# Patient Record
Sex: Female | Born: 1980 | Race: Black or African American | Hispanic: No | Marital: Married | Smoking: Never smoker
Health system: Southern US, Community
[De-identification: ages and names within clinical notes are randomized; demographics above are authoritative.]

## PROBLEM LIST (undated history)

## (undated) DIAGNOSIS — G43909 Migraine, unspecified, not intractable, without status migrainosus: Secondary | ICD-10-CM

## (undated) DIAGNOSIS — F419 Anxiety disorder, unspecified: Secondary | ICD-10-CM

## (undated) DIAGNOSIS — I10 Essential (primary) hypertension: Secondary | ICD-10-CM

## (undated) DIAGNOSIS — E282 Polycystic ovarian syndrome: Secondary | ICD-10-CM

## (undated) DIAGNOSIS — F32A Depression, unspecified: Secondary | ICD-10-CM

## (undated) DIAGNOSIS — F329 Major depressive disorder, single episode, unspecified: Secondary | ICD-10-CM

## (undated) HISTORY — DX: Essential (primary) hypertension: I10

## (undated) HISTORY — DX: Anxiety disorder, unspecified: F41.9

## (undated) HISTORY — DX: Polycystic ovarian syndrome: E28.2

## (undated) HISTORY — DX: Depression, unspecified: F32.A

## (undated) HISTORY — DX: Migraine, unspecified, not intractable, without status migrainosus: G43.909

---
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
## 1999-09-04 ENCOUNTER — Emergency Department (HOSPITAL_COMMUNITY): Admission: EM | Admit: 1999-09-04 | Discharge: 1999-09-04 | Payer: Self-pay | Admitting: Emergency Medicine

## 1999-09-08 ENCOUNTER — Emergency Department (HOSPITAL_COMMUNITY): Admission: EM | Admit: 1999-09-08 | Discharge: 1999-09-08 | Payer: Self-pay

## 2000-08-25 ENCOUNTER — Other Ambulatory Visit: Admission: RE | Admit: 2000-08-25 | Discharge: 2000-08-25 | Payer: Self-pay | Admitting: Family Medicine

## 2000-09-06 ENCOUNTER — Emergency Department (HOSPITAL_COMMUNITY): Admission: EM | Admit: 2000-09-06 | Discharge: 2000-09-06 | Payer: Self-pay | Admitting: Internal Medicine

## 2000-09-06 ENCOUNTER — Encounter: Payer: Self-pay | Admitting: Internal Medicine

## 2000-09-07 ENCOUNTER — Encounter: Payer: Self-pay | Admitting: Emergency Medicine

## 2000-09-07 ENCOUNTER — Emergency Department (HOSPITAL_COMMUNITY): Admission: EM | Admit: 2000-09-07 | Discharge: 2000-09-07 | Payer: Self-pay | Admitting: Emergency Medicine

## 2001-01-19 ENCOUNTER — Encounter: Admission: RE | Admit: 2001-01-19 | Discharge: 2001-04-19 | Payer: Self-pay | Admitting: Family Medicine

## 2003-01-03 ENCOUNTER — Other Ambulatory Visit: Admission: RE | Admit: 2003-01-03 | Discharge: 2003-01-03 | Payer: Self-pay | Admitting: *Deleted

## 2003-06-14 ENCOUNTER — Ambulatory Visit (HOSPITAL_COMMUNITY): Admission: RE | Admit: 2003-06-14 | Discharge: 2003-06-14 | Payer: Self-pay | Admitting: Gynecology

## 2003-06-14 ENCOUNTER — Ambulatory Visit (HOSPITAL_BASED_OUTPATIENT_CLINIC_OR_DEPARTMENT_OTHER): Admission: RE | Admit: 2003-06-14 | Discharge: 2003-06-14 | Payer: Self-pay | Admitting: Gynecology

## 2003-06-14 ENCOUNTER — Encounter (INDEPENDENT_AMBULATORY_CARE_PROVIDER_SITE_OTHER): Payer: Self-pay | Admitting: *Deleted

## 2004-05-11 ENCOUNTER — Other Ambulatory Visit: Admission: RE | Admit: 2004-05-11 | Discharge: 2004-05-11 | Payer: Self-pay | Admitting: Gynecology

## 2005-08-03 ENCOUNTER — Other Ambulatory Visit: Admission: RE | Admit: 2005-08-03 | Discharge: 2005-08-03 | Payer: Self-pay | Admitting: Gynecology

## 2006-07-26 HISTORY — PX: ENDOMETRIAL BIOPSY: SHX622

## 2006-11-23 ENCOUNTER — Encounter (INDEPENDENT_AMBULATORY_CARE_PROVIDER_SITE_OTHER): Payer: Self-pay | Admitting: Specialist

## 2006-11-23 ENCOUNTER — Ambulatory Visit (HOSPITAL_COMMUNITY): Admission: RE | Admit: 2006-11-23 | Discharge: 2006-11-23 | Payer: Self-pay | Admitting: Obstetrics and Gynecology

## 2008-05-01 ENCOUNTER — Encounter: Admission: RE | Admit: 2008-05-01 | Discharge: 2008-05-01 | Payer: Self-pay | Admitting: Family Medicine

## 2010-12-11 NOTE — H&P (Signed)
Molly Clements, Molly Clements                     ACCOUNT NO.:  [id number]   MEDICAL RECORD NO.:  [id number]                   PATIENT TYPE:  AMB   LOCATION:  [facility]                                 FACILITY:  [facility]   PHYSICIAN:  Juan H. Lily Peer, M.D.             DATE OF BIRTH:  September 24, 1980   DATE OF ADMISSION:  DATE OF DISCHARGE:                                HISTORY & PHYSICAL   DATE OF SCHEDULED SURGERY:  Friday, June 14, 2003 at 7:30 a.m. at [facility].   CHIEF COMPLAINT:  Dysfunctional uterine bleeding attributed to endometrial  polyp.   HISTORY:  The patient is a 30 year old gravida 0 who was seen as a new  patient in our office on May 10, 2003 as a consult from Clovis Riley,  nurse practitioner here in [place] due to the fact that the patient had  had history of oligomenorrhea.  Her workup had consisted of TSH, prolactin,  17-hydroxyprogesterone, and DHEAS which were normal.  Prior to referral to  our office she had had an [facility] and LH that had been normal as well.  The  patient as part of her workup had an ultrasound at which there was a  questionable elongated polyp in the endometrial cavity so she was seen in  the office on November 3 for a sonohysterogram which demonstrated evidence  of what appears to be polycystic ovaries based on the patient being  overweight and some evidence of hirsutism.  The sonohysterogram demonstrated  that the endometrial cavity had two wall defects noted measuring 5 x 3 mm  and 6 x 7 mm near the lower uterine segment.  The uterus was normal  dimensions - 8.6 x 3.1 x 5.5. cm.  The patient had had a full examination  and Pap smear in the family practice office in September 2004 which was  reported to be normal.   PAST MEDICAL HISTORY:  She is using condoms and withdrawal as a form of  contraception.  She denies any allergies and besides weight gain and  hirsutism are the only major problems that the patient has complained  of,  along with her oligomenorrhea.   FAMILY HISTORY:  Significant for maternal grandmother with diabetes and  mother with hypertension.   She denies smoking and caffeine one to three times per week, moderate  exercise.   PHYSICAL EXAMINATION:  VITAL SIGNS:  The patient weighs 205 pounds.  Her  blood pressure in the office was 130/90.  HEENT:  Unremarkable.  NECK:  Supple, trachea midline.  No carotid bruits.  There was questionable  thyromegaly.  Recent TSH was normal.  There were no bruits.  HEART:  Regular rate and rhythm, no murmurs or gallop.  BREAST:  Reported to be normal on the exam of May 10, 2003.  ABDOMEN:  Soft, nontender.  PELVIC:  Bartholin, urethra, Skene glands within normal limits.  Vagina and  cervix:  No gross lesion  on inspection.  Uterus slightly anteverted; normal  size, shape, and consistency.  Adnexa difficult to evaluate due to the  patient's abdominal girth but it was normal on ultrasound.   ASSESSMENT:  A 30 year old gravida 0 with history of being overweight  contributing to her oligomenorrhea and hirsutism, polycystic ovary-type  picture.  It appears on ultrasound that she had two polyps in the lower  uterine segment.  She was seen in the office today on November 18 whereby a  laminaria was placed endocervically to remove it the day of surgery to  facilitate insertion of the operative hysteroscope.  The risks, benefits,  pros, and cons of the procedure were discussed including infection,  bleeding, perforation of the uterus with instrumentation.  She will receive  prophylaxis antibiotic.  She is also increased risk for deep venous  thrombosis and blood clot in her legs and pulmonary embolism, and also  anesthesia to discuss the risks as well.  All the above were discussed with  the patient.  Also, the risk of fluid overload and pulmonary edema from the  intrauterine distending medium.  All these issues were discussed.  All  questions were answered  and will follow accordingly.   PLAN:  As per assessment above.  Once we get the pathology report back will  start her on oral contraceptive pill with a diuretic and low androgen such  as Yasmin.                                               Juan H. Lily Peer, M.D.    JHF/MEDQ  D:  06/13/2003  T:  06/13/2003  Job:  [id number]

## 2010-12-11 NOTE — Op Note (Signed)
Molly Clements, Molly Clements           ACCOUNT NO.:  [id number]   MEDICAL RECORD NO.:  [id number]          PATIENT TYPE:  AMB   LOCATION:  [facility]                           FACILITY:  [facility]   PHYSICIAN:  Maxie Better, M.D.DATE OF BIRTH:  1980/10/05   DATE OF PROCEDURE:  11/23/2006  DATE OF DISCHARGE:                               OPERATIVE REPORT   PREOPERATIVE DIAGNOSIS:  Menorrhagia, endometrial mass.   PROCEDURE:  Diagnostic hysteroscopy, hysteroscopic resection endometrial  polyps, dilation and curettage.   POSTOPERATIVE DIAGNOSIS:  Menorrhagia, endometrial polyps, possible  uterine anomaly.   ANESTHESIA:  General.   SURGEON:  Maxie Better, M.D.   PROCEDURE:  Under adequate general anesthesia the patient is placed in  the dorsolithotomy position.  She was sterilely prepped and draped in  the usual fashion.  The bladder was catheterized for large amount of  urine.  Examination under anesthesia revealed anteverted uterus, no  adnexal masses could be appreciated. The bivalve speculum placed in the  vagina.  10 mL of 1% Nesacaine was injected paracervical at 3 and 9  o'clock.  The anterior lip of the cervix grasped with a single-tooth  tenaculum.  The cervix was then serially dilated up to #31 [facility]  dilator.  Resectoscope was introduced into the uterine cavity. 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 There was a  right anterior polypoid lesion noted.  No lesions noted in the cervical  canal.  The polypoid lesions were resected and the resectoscope removed.  The cavity was curetted for small amount of additional tissue.  The  procedure was continued with the resectoscope being reinserted.  The  cavity inspected.  No lesions noted and resectoscope was then removed.  Procedure was then terminated by removing  all instruments from the  vagina.  Specimen labeled endometrial curetting and endometrial polyps  was sent to pathology.  Estimated blood loss was minimal. Fluid deficit  was 150 mL.  Complication was none.  The patient tolerated the procedure  well was transferred to recovery in stable condition.      Maxie Better, M.D.  Electronically Signed     Russell/MEDQ  D:  11/23/2006  T:  11/23/2006  Job:  [id number]

## 2011-09-16 ENCOUNTER — Encounter: Payer: Self-pay | Admitting: Physician Assistant

## 2012-03-02 ENCOUNTER — Ambulatory Visit (INDEPENDENT_AMBULATORY_CARE_PROVIDER_SITE_OTHER): Payer: Commercial Managed Care - PPO | Admitting: Physician Assistant

## 2012-03-02 ENCOUNTER — Encounter: Payer: Self-pay | Admitting: Physician Assistant

## 2012-03-02 VITALS — BP 163/104 | HR 103 | Temp 98.2°F | Resp 20 | Ht 65.0 in | Wt 275.0 lb

## 2012-03-02 DIAGNOSIS — Z0000 Encounter for general adult medical examination without abnormal findings: Secondary | ICD-10-CM

## 2012-03-02 DIAGNOSIS — G43909 Migraine, unspecified, not intractable, without status migrainosus: Secondary | ICD-10-CM | POA: Insufficient documentation

## 2012-03-02 DIAGNOSIS — E669 Obesity, unspecified: Secondary | ICD-10-CM

## 2012-03-02 DIAGNOSIS — I10 Essential (primary) hypertension: Secondary | ICD-10-CM

## 2012-03-02 DIAGNOSIS — E282 Polycystic ovarian syndrome: Secondary | ICD-10-CM | POA: Insufficient documentation

## 2012-03-02 DIAGNOSIS — E119 Type 2 diabetes mellitus without complications: Secondary | ICD-10-CM | POA: Insufficient documentation

## 2012-03-02 DIAGNOSIS — R739 Hyperglycemia, unspecified: Secondary | ICD-10-CM

## 2012-03-02 DIAGNOSIS — R7989 Other specified abnormal findings of blood chemistry: Secondary | ICD-10-CM

## 2012-03-02 LAB — COMPREHENSIVE METABOLIC PANEL
ALT: 67 U/L — ABNORMAL HIGH (ref 0–35)
AST: 82 U/L — ABNORMAL HIGH (ref 0–37)
Albumin: 4.7 g/dL (ref 3.5–5.2)
Alkaline Phosphatase: 74 U/L (ref 39–117)
BUN: 10 mg/dL (ref 6–23)
CO2: 23 mEq/L (ref 19–32)
Calcium: 10 mg/dL (ref 8.4–10.5)
Chloride: 98 mEq/L (ref 96–112)
Creat: 0.75 mg/dL (ref 0.50–1.10)
Glucose, Bld: 282 mg/dL — ABNORMAL HIGH (ref 70–99)
Potassium: 4.3 mEq/L (ref 3.5–5.3)
Sodium: 134 mEq/L — ABNORMAL LOW (ref 135–145)
Total Bilirubin: 0.8 mg/dL (ref 0.3–1.2)
Total Protein: 8.1 g/dL (ref 6.0–8.3)

## 2012-03-02 LAB — CBC WITH DIFFERENTIAL/PLATELET
Basophils Absolute: 0.1 10*3/uL (ref 0.0–0.1)
Basophils Relative: 1 % (ref 0–1)
HCT: 43.4 % (ref 36.0–46.0)
Lymphocytes Relative: 45 % (ref 12–46)
Neutro Abs: 3.5 10*3/uL (ref 1.7–7.7)
Neutrophils Relative %: 45 % (ref 43–77)
Platelets: 287 10*3/uL (ref 150–400)
RDW: 12.8 % (ref 11.5–15.5)
WBC: 7.8 10*3/uL (ref 4.0–10.5)

## 2012-03-02 LAB — POCT GLYCOSYLATED HEMOGLOBIN (HGB A1C): Hemoglobin A1C: >=14

## 2012-03-02 LAB — GLUCOSE, POCT (MANUAL RESULT ENTRY): POC Glucose: 296 mg/dl — AB (ref 70–99)

## 2012-03-02 LAB — TSH: TSH: 2.192 u[IU]/mL (ref 0.350–4.500)

## 2012-03-02 MED ORDER — METFORMIN HCL 500 MG PO TABS: 500.0000 mg | ORAL_TABLET | Freq: Two times a day (BID) | ORAL | Status: DC

## 2012-03-02 MED ORDER — LISINOPRIL-HYDROCHLOROTHIAZIDE 10-12.5 MG PO TABS: 1.0000 | ORAL_TABLET | Freq: Every day | ORAL | Status: DC

## 2012-03-02 MED ORDER — TOPIRAMATE 50 MG PO TABS: 50.0000 mg | ORAL_TABLET | Freq: Two times a day (BID) | ORAL | Status: DC

## 2012-03-02 NOTE — Patient Instructions (Addendum)
It's very important that you take the birth control while you are taking the lisinoprilHCT, as it can cause serious birth defects.  When you are ready to get pregnant, we'll change the blood pressure medication BEFORE you start trying.  Keep up your efforts for weight loss through healthy eating and regular exercise.  When starting the topiramate for your headaches, start with 1/2 tablet each evening for 4 nights, then increase to a whole tablet at night for 4 nights, then increase to 1/2 tablet each morning AND a WHOLE tablet at night for 4 days, then increase to a whole tablet twice each day.  When starting the metformin, take one each morning for the first week, then increase it to twice each day.  Keeping You Healthy  Get These Tests 1. Blood Pressure- Have your blood pressure checked once a year by your health care provider.  Normal blood pressure is 120/80. 2. Weight- Have your body mass index (BMI) calculated to screen for obesity.  BMI is measure of body fat based on height and weight.  You can also calculate your own BMI at https://www.west-esparza.com/. 3. Cholesterol- Have your cholesterol checked every 5 years starting at age 83 then yearly starting at age 8. 4. Chlamydia, HIV, and other sexually transmitted diseases- Get screened every year until age 54, then within three months of each new sexual provider. 5. Pap Smear- Every 1-3 years; discuss with your health care provider. 6. Mammogram- Every year starting at age 52  Take these medicines  Calcium with Vitamin D-Your body needs 1200 mg of Calcium each day and [phone] IU of Vitamin D daily.  Your body can only absorb 500 mg of Calcium at a time so Calcium must be taken in 2 or 3 divided doses throughout the day.  Multivitamin with folic acid- Once daily if it is possible for you to become pregnant.  Get these Immunizations  Gardasil-Series of three doses; prevents HPV related illness such as genital warts and cervical  cancer.  Menactra-Single dose; prevents meningitis.  Tetanus shot- Every 10 years.  Flu shot-Every year.  Take these steps 1. Do not smoke-Your healthcare provider can help you quit.  For tips on how to quit go to www.smokefree.gov or call 1-800 QUITNOW. 2. Be physically active- Exercise 5 days a week for at least 30 minutes.  If you are not already physically active, start slow and gradually work up to 30 minutes of moderate physical activity.  Examples of moderate activity include walking briskly, dancing, swimming, bicycling, etc. 3. Breast Cancer- A self breast exam every month is important for early detection of breast cancer.  For more information and instruction on self breast exams, ask your healthcare provider or [place].es. 4. Eat a healthy diet- Eat a variety of healthy foods such as fruits, vegetables, whole grains, low fat milk, low fat cheeses, yogurt, lean meats, poultry and fish, beans, nuts, tofu, etc.  For more information go to www. Thenutritionsource.org 5. Drink alcohol in moderation- Limit alcohol intake to one drink or less per day. Never drink and drive. 6. Depression- Your emotional health is as important as your physical health.  If you're feeling down or losing interest in things you normally enjoy please talk to your healthcare provider about being screened for depression. 7. Dental visit- Brush and floss your teeth twice daily; visit your dentist twice a year. 8. Eye doctor- Get an eye exam at least every 2 years. 9. Helmet use- Always wear a helmet when riding a  bicycle, motorcycle, rollerblading or skateboarding. 10. Safe sex- If you may be exposed to sexually transmitted infections, use a condom. 11. Seat belts- Seat belts can save your live; always wear one. 12. Smoke/Carbon Monoxide detectors- These detectors need to be installed on the appropriate level of your home. Replace batteries at least once a year. 13. Skin cancer-  When out in the sun please cover up and use sunscreen 15 SPF or higher. 14. Violence- If anyone is threatening or hurting you, please tell your healthcare provider.

## 2012-03-02 NOTE — Assessment & Plan Note (Addendum)
Probably new onset DM type 2. Start metformin 500 mg QD x 3 days, the BID.  Continue efforts for weight loss through healthy eating and regular exercise.  Recheck in 6 weeks.

## 2012-03-02 NOTE — Assessment & Plan Note (Signed)
Restart COC.   Start metformin for PCOS AND probable new onset DM type 2. Continue efforts for healthy eating and regular exercise.

## 2012-03-02 NOTE — Assessment & Plan Note (Signed)
Restart LisinoprilHCT 10/12.5 PO QD.  Continue efforts for healthy eating and regular exercise.  MUST resume contraception.  Re-evaluate in 6 weeks.

## 2012-03-02 NOTE — Assessment & Plan Note (Signed)
Start topiramate and increase to 50 mg BID. Re-check in 6 weeks.

## 2012-03-02 NOTE — Progress Notes (Signed)
Subjective:    Patient ID: Molly Clements, female    DOB: 04/30/1981, 31 y.o.   MRN: [id number]  HPI 31 year old female here today for a Complete Physical. Dr.Cousins performs her annual breast and GYN exams.  She's working on lifestyle modification for weight loss.   Review of Systems  Constitutional: Negative.   Eyes: Negative.   Respiratory: Negative.   Cardiovascular: Negative.   Gastrointestinal: Positive for nausea (perhaps from stress).       Patient complains of some nausea and diarrhea  Genitourinary: Positive for menstrual problem (PCOS).  Musculoskeletal:       Problem with walking  Skin: Positive for rash (developed after received Tdap at GYN in 01/2012.  Improved with Benadryl, but not completely resolved.).  Neurological: Positive for headaches (migraines 3-4 times each week, triggered by work stress).  Hematological: Negative.   Psychiatric/Behavioral: Negative.     Past Medical History  Diagnosis Date   Migraine    PCOS    Obesity   . Hypertension     Past Surgical History  Procedure Date  . Endometrial biopsy 2008    polypectomy    Prior to Admission medications          NONE         Allergies  Allergen Reactions  . Bee Venom Anaphylaxis    Hives swelling    History   Social History  . Marital Status: Single    Spouse Name: n/a    Number of Children: 0  . Years of Education: 13.5   Occupational History  . customer service    Social History Main Topics  . Smoking status: Never Smoker   . Smokeless tobacco: Never Used  . Alcohol Use: No  . Drug Use: No  . Sexually Active: Yes -- Female partner(s)    Birth Control/ Protection: None     has a RX but hasn't filled it yet.    Family History  Problem Relation Age of Onset  . Hypertension Mother   . Depression Father   . Drug abuse Father   . Stroke Maternal Grandmother   . Hypertension Maternal Grandfather   . Cancer Paternal Grandmother        Objective:   Physical Exam    Vitals reviewed. Constitutional: She is oriented to person, place, and time. She appears well-developed and well-nourished. No distress.  HENT:  Head: Normocephalic and atraumatic.  Right Ear: Hearing, tympanic membrane, external ear and ear canal normal. No foreign bodies.  Left Ear: Hearing, tympanic membrane, external ear and ear canal normal. No foreign bodies.  Nose: Nose normal.  Mouth/Throat: Uvula is midline, oropharynx is clear and moist and mucous membranes are normal. No oral lesions. Normal dentition. No dental abscesses or uvula swelling. No oropharyngeal exudate.  Eyes: Conjunctivae and EOM are normal. Pupils are equal, round, and reactive to light. Right eye exhibits no discharge. Left eye exhibits no discharge. No scleral icterus.  Fundoscopic exam:      The right eye shows no arteriolar narrowing, no AV nicking, no exudate, no hemorrhage and no papilledema. The right eye shows red reflex.The right eye shows no venous pulsations.      The left eye shows no arteriolar narrowing, no AV nicking, no exudate, no hemorrhage and no papilledema. The left eye shows red reflex.The left eye shows no venous pulsations. Neck: Trachea normal, normal range of motion and full passive range of motion without pain. Neck supple. No spinous process tenderness and  no muscular tenderness present. No mass and no thyromegaly present.  Cardiovascular: Normal rate, regular rhythm, normal heart sounds, intact distal pulses and normal pulses.   Pulmonary/Chest: Effort normal and breath sounds normal. She exhibits no tenderness and no retraction. Right breast exhibits no inverted nipple, no mass, no nipple discharge, no skin change and no tenderness. Left breast exhibits no inverted nipple, no mass, no nipple discharge, no skin change and no tenderness. Breasts are symmetrical.  Abdominal: Normal appearance. There is no hepatosplenomegaly. There is no rigidity, no CVA tenderness, no tenderness at McBurney's  point and negative Murphy's sign. No hernia.  Musculoskeletal: She exhibits no edema and no tenderness.       Cervical back: Normal.       Thoracic back: Normal.       Lumbar back: Normal.  Lymphadenopathy:       Head (right side): No tonsillar, no preauricular, no posterior auricular and no occipital adenopathy present.       Head (left side): No tonsillar, no preauricular, no posterior auricular and no occipital adenopathy present.    She has no cervical adenopathy.    She has no axillary adenopathy.       Right: No supraclavicular adenopathy present.       Left: No supraclavicular adenopathy present.  Neurological: She is alert and oriented to person, place, and time. She has normal strength and normal reflexes. No cranial nerve deficit. She exhibits normal muscle tone. Coordination and gait normal.  Skin: Skin is warm, dry and intact. No rash noted. She is not diaphoretic. No cyanosis or erythema. Nails show no clubbing.  Psychiatric: She has a normal mood and affect. Her speech is normal and behavior is normal. Judgment and thought content normal.   Results for orders placed in visit on 03/02/12  GLUCOSE, POCT (MANUAL RESULT ENTRY)      Component Value Range   POC Glucose 296 (*) 70 - 99 mg/dl  POCT GLYCOSYLATED HEMOGLOBIN (HGB A1C)      Component Value Range   Hemoglobin A1C =>14.0        Assessment & Plan:

## 2012-03-02 NOTE — Assessment & Plan Note (Signed)
Continue efforts for weight loss through healthy eating and regular exercise.

## 2012-03-03 ENCOUNTER — Encounter: Payer: Self-pay | Admitting: Physician Assistant

## 2012-04-13 ENCOUNTER — Ambulatory Visit: Payer: Commercial Managed Care - PPO | Admitting: Physician Assistant

## 2012-05-25 ENCOUNTER — Ambulatory Visit (INDEPENDENT_AMBULATORY_CARE_PROVIDER_SITE_OTHER): Payer: 59 | Admitting: Physician Assistant

## 2012-05-25 ENCOUNTER — Encounter: Payer: Self-pay | Admitting: Physician Assistant

## 2012-05-25 VITALS — BP 132/100 | HR 126 | Temp 98.0°F | Resp 18 | Ht 65.0 in | Wt 273.0 lb

## 2012-05-25 DIAGNOSIS — R05 Cough: Secondary | ICD-10-CM

## 2012-05-25 DIAGNOSIS — G43909 Migraine, unspecified, not intractable, without status migrainosus: Secondary | ICD-10-CM

## 2012-05-25 DIAGNOSIS — Z23 Encounter for immunization: Secondary | ICD-10-CM

## 2012-05-25 DIAGNOSIS — R7309 Other abnormal glucose: Secondary | ICD-10-CM

## 2012-05-25 DIAGNOSIS — R739 Hyperglycemia, unspecified: Secondary | ICD-10-CM

## 2012-05-25 DIAGNOSIS — E119 Type 2 diabetes mellitus without complications: Secondary | ICD-10-CM

## 2012-05-25 LAB — GLUCOSE, POCT (MANUAL RESULT ENTRY): POC Glucose: 394 mg/dl — AB (ref 70–99)

## 2012-05-25 NOTE — Progress Notes (Signed)
Subjective:    Patient ID: Molly Clements, female    DOB: 1981/06/09, 31 y.o.   MRN: [id number]  HPI This 31 y.o. female presents for evaluation of cough and for re-evaluation of HTN and elevated glucose.  The cough began about a month ago when she had a cold.  All the symptoms resolved as expected except for the cough, which is generally non-productive.  She feels well, without increased fatigue, fever, chills, HA.  She has had some burning chest pain when eating spicy foods, but no nausea, indigestion, increased belching or abdominal pain.She notes an increase in urinary frequency since starting lisinoprilHCT, but no urgency or burning.   Additionally, she has had 3 migraine HA this month, and missed 4 days of work.  HR advised her to get FMLA papers completed to protect her employment.  Overall, she's had fewer HA since starting the topiramate, and believes that the recent weather changes have caused the increased HA.  She filled the RX she had for birth control pills and reports taking them consistently, as advised.  Review of Systems As above.  No SOB, dizziness, visual disturbance.    Past Medical History  Diagnosis Date  . Hypertension   . Migraine   . PCOS (polycystic ovarian syndrome)     Past Surgical History  Procedure Date  . Endometrial biopsy 2008    polypectomy    Prior to Admission medications   Medication Sig Start Date End Date Taking? Authorizing Provider  lisinopril-hydrochlorothiazide (PRINZIDE,ZESTORETIC) 10-12.5 MG per tablet Take 1 tablet by mouth daily. 03/02/12 03/02/13 Yes Kieanna Rollo S Azir Muzyka, PA-C  metFORMIN (GLUCOPHAGE) 500 MG tablet Take 1 tablet (500 mg total) by mouth 2 (two) times daily with a meal. 03/02/12 03/02/13 Yes Jusiah Aguayo S Brevin Mcfadden, PA-C  topiramate (TOPAMAX) 50 MG tablet Take 1 tablet (50 mg total) by mouth 2 (two) times daily. 03/02/12 03/02/13 Yes Kiernan Atkerson Tessa Lerner, PA-C    Allergies  Allergen Reactions  . Bee Venom Anaphylaxis    Hives swelling     History   Social History  . Marital Status: Single    Spouse Name: n/a    Number of Children: 0  . Years of Education: 13.5   Occupational History  . customer service    Social History Main Topics  . Smoking status: Never Smoker   . Smokeless tobacco: Never Used  . Alcohol Use: No  . Drug Use: No  . Sexually Active: Yes -- Female partner(s)    Birth Control/ Protection: Pill   Other Topics Concern  . Not on file   Social History Narrative   Lives with fiance Salley Hews    Family History  Problem Relation Age of Onset  . Hypertension Mother   . Depression Father   . Drug abuse Father   . Stroke Maternal Grandmother   . Hypertension Maternal Grandfather   . Cancer Paternal Grandmother        Objective:   Physical Exam Blood pressure 132/100, pulse 126, temperature 98 F (36.7 C), temperature source Oral, resp. rate 18, height 5\' 5"  (1.651 m), weight 273 lb (123.832 kg), last menstrual period 04/20/2012, SpO2 96.00%. Body mass index is 45.43 kg/(m^2). She has lost 2 pounds since her last visit. Well-developed, well nourished obese BF who is awake, alert and oriented, in NAD. HEENT: [place]/AT, sclera and conjunctiva are clear.   Neck: supple, non-tender, no lymphadenopathy, thyromegaly. Heart: RRR, no murmur Lungs: normal effort, CTA Extremities: no cyanosis, clubbing or edema. Skin: warm  and dry without rash. Psychologic: good mood and appropriate affect, normal speech and behavior.  Results for orders placed in visit on 05/25/12  GLUCOSE, POCT (MANUAL RESULT ENTRY)      Component Value Range   POC Glucose 394 (*) 70 - 99 mg/dl      Assessment & Plan:   1. Migraine  Continue topiramate.  FMLA forms completed  2. Cough  Possibly post-viral cough, but very likely ACEI cough. Hold lisinoprilHCT x 2 weeks.  Replace with losartanHCT 50/12.5.  RTC 4-6 weeks.  3. Hyperglycemia  POCT glucose (manual entry).  She left before the results were completed.  This is now  clearly DM.  Increase the metformin to 1000 mg PO BID.  4. Need for influenza vaccination  Flu vaccine greater than or equal to 3yo preservative free IM

## 2012-05-26 ENCOUNTER — Telehealth: Payer: Self-pay | Admitting: Physician Assistant

## 2012-05-26 ENCOUNTER — Encounter: Payer: Self-pay | Admitting: Physician Assistant

## 2012-05-26 MED ORDER — LOSARTAN POTASSIUM-HCTZ 50-12.5 MG PO TABS: 1.0000 | ORAL_TABLET | Freq: Every day | ORAL | Status: DC

## 2012-05-26 NOTE — Assessment & Plan Note (Signed)
Increase metformin to 1000 mg PO BID. RTC 4-6 weeks. Plan referral to nutrition and diabetes education, pneumococcal vaccine, dental and eye exams if not current.

## 2012-05-26 NOTE — Telephone Encounter (Signed)
Left message for her to call me back. 

## 2012-05-26 NOTE — Telephone Encounter (Signed)
Please call patient.  Advise her that in addition to changing the BP medication, we need to adjust her metformin.  Her glucose yesterday was almost 400.  Increase the metformin to 1000 mg PO BID (she can take 2 of the 500 mg tabs that she already has).  If she needs a new Rx, please let me know.  Otherwise, I'll see her in 4-6 weeks.

## 2012-05-29 NOTE — Telephone Encounter (Signed)
Called, left message again, detailed this time about why I need her to call me back.

## 2012-05-30 NOTE — Telephone Encounter (Signed)
LMOM at both H and C #s to CB. Advised her that we need to talk w/her about a medication change Chelle wants for her to make and it is important that she CB.

## 2012-06-04 MED ORDER — METFORMIN HCL 1000 MG PO TABS: 1000.0000 mg | ORAL_TABLET | Freq: Two times a day (BID) | ORAL | Status: DC

## 2012-06-04 NOTE — Telephone Encounter (Signed)
Advised pt of notes and new rx for metformin sent in

## 2013-06-05 ENCOUNTER — Other Ambulatory Visit: Payer: Self-pay | Admitting: Physician Assistant

## 2013-07-31 ENCOUNTER — Other Ambulatory Visit: Payer: Self-pay

## 2013-07-31 ENCOUNTER — Ambulatory Visit: Payer: 59 | Admitting: Family Medicine

## 2013-07-31 VITALS — BP 166/104 | HR 100 | Temp 98.3°F | Resp 20 | Ht 64.5 in | Wt 269.0 lb

## 2013-07-31 DIAGNOSIS — J069 Acute upper respiratory infection, unspecified: Secondary | ICD-10-CM

## 2013-07-31 DIAGNOSIS — J029 Acute pharyngitis, unspecified: Secondary | ICD-10-CM

## 2013-07-31 DIAGNOSIS — E1165 Type 2 diabetes mellitus with hyperglycemia: Secondary | ICD-10-CM

## 2013-07-31 DIAGNOSIS — I10 Essential (primary) hypertension: Secondary | ICD-10-CM

## 2013-07-31 DIAGNOSIS — E119 Type 2 diabetes mellitus without complications: Secondary | ICD-10-CM

## 2013-07-31 DIAGNOSIS — IMO0001 Reserved for inherently not codable concepts without codable children: Secondary | ICD-10-CM

## 2013-07-31 LAB — POCT CBC
GRANULOCYTE PERCENT: 58.5 % (ref 37–80)
HCT, POC: 48.2 % — AB (ref 37.7–47.9)
HEMOGLOBIN: 14.9 g/dL (ref 12.2–16.2)
Lymph, poc: 4.3 — AB (ref 0.6–3.4)
MCH, POC: 26.4 pg — AB (ref 27–31.2)
MCHC: 30.9 g/dL — AB (ref 31.8–35.4)
MCV: 85.3 fL (ref 80–97)
MID (cbc): 0.8 (ref 0–0.9)
MPV: 11.8 fL (ref 0–99.8)
PLATELET COUNT, POC: 275 10*3/uL (ref 142–424)
POC Granulocyte: 7.3 — AB (ref 2–6.9)
POC LYMPH PERCENT: 34.9 %L (ref 10–50)
POC MID %: 6.6 % (ref 0–12)
RBC: 5.65 M/uL — AB (ref 4.04–5.48)
RDW, POC: 13.7 %
WBC: 12.4 10*3/uL — AB (ref 4.6–10.2)

## 2013-07-31 LAB — POCT GLYCOSYLATED HEMOGLOBIN (HGB A1C): HEMOGLOBIN A1C: 11.7

## 2013-07-31 LAB — GLUCOSE, POCT (MANUAL RESULT ENTRY): POC GLUCOSE: 298 mg/dL — AB (ref 70–99)

## 2013-07-31 MED ORDER — IPRATROPIUM BROMIDE 0.03 % NA SOLN: 2.0000 | Freq: Four times a day (QID) | NASAL | Status: DC

## 2013-07-31 MED ORDER — AZITHROMYCIN 250 MG PO TABS: ORAL_TABLET | ORAL | Status: DC

## 2013-07-31 MED ORDER — LOSARTAN POTASSIUM-HCTZ 50-12.5 MG PO TABS: 1.0000 | ORAL_TABLET | Freq: Every day | ORAL | Status: DC

## 2013-07-31 MED ORDER — MAGIC MOUTHWASH W/LIDOCAINE: 10.0000 mL | ORAL | Status: DC | PRN

## 2013-07-31 MED ORDER — BLOOD GLUCOSE METER KIT: PACK | Status: DC

## 2013-07-31 MED ORDER — METFORMIN HCL 1000 MG PO TABS: 1000.0000 mg | ORAL_TABLET | Freq: Two times a day (BID) | ORAL | Status: DC

## 2013-07-31 MED ORDER — GLIPIZIDE 10 MG PO TABS: 10.0000 mg | ORAL_TABLET | Freq: Two times a day (BID) | ORAL | Status: DC

## 2013-07-31 MED ORDER — GLUCOSE BLOOD VI STRP: ORAL_STRIP | Status: DC

## 2013-07-31 MED ORDER — DM-GUAIFENESIN ER 30-600 MG PO TB12: 2.0000 | ORAL_TABLET | Freq: Two times a day (BID) | ORAL | Status: DC | PRN

## 2013-07-31 MED ORDER — LANCETS MISC: Status: DC

## 2013-07-31 NOTE — Progress Notes (Addendum)
This chart was scribed for Sherren Mocha, MD by Luisa Dago, ED Scribe. This patient was seen in room 10 and the patient's care was started at 4:40 PM. Subjective:    Patient ID: Molly Clements, female    DOB: [birth year], 33 y.o.   MRN: [id number]  Chief Complaint  Patient presents with  . Sore Throat    low grade Fever  x 4 days  . Headache  . Cough    HPI HPI Comments: Molly Clements is a 33 y.o. female who presents to [facility] complaining of worsening sore throat that started 4 days ago. Pt states that her symptoms started with a dry cough. She states that she has noticed some voice changes, and a low grade fever with a TMAX reading of 99.6 this morning. She is also complaining of nasal congestion, frontal headache, and wheezing. Pt states that her sore throat is worsened by the dry cough. She states that she has been taking some rx cough syrup from a prior illness that makes her symptoms worse - she thinks this is Tussionex. Works in [employer] bi[employer]ist so sore throat and voice changes are very distressing to her - cough makes it very hard to do her job. Here w/ her husband who is also ill.  Pt has a history of hypertension. The last time she took her blood pressure medication was 5 days ago. Just didn't feel like taking it when she was ill.  Unsure if she really has DM. Does not have glucometer at home. Has seen a nutritionist in the past and does have a gym membership which she isn't using but plan to.  Was start on metformin for PCOS but then dose was increased to 1000 bid when her sugar was high - however, taking it only qd at most as doesn't seem to help w/ anything.   Past Medical History  Diagnosis Date  . Hypertension   . Migraine   . PCOS (polycystic ovarian syndrome)    Allergies  Allergen Reactions  . Bee Venom Anaphylaxis    Hives swelling   Current Outpatient Prescriptions on File Prior to Visit  Medication Sig Dispense Refill  .  losartan-hydrochlorothiazide (HYZAAR) 50-12.5 MG per tablet Take 1 tablet by mouth daily. PATIENT NEEDS OFFICE VISIT FOR ADDITIONAL REFILLS  30 tablet  0  . metFORMIN (GLUCOPHAGE) 1000 MG tablet Take 1 tablet (1,000 mg total) by mouth 2 (two) times daily with a meal.  60 tablet  1  . HEATHER 0.35 MG tablet Take 1 tablet by mouth daily.      Marland Kitchen topiramate (TOPAMAX) 50 MG tablet Take 1 tablet (50 mg total) by mouth 2 (two) times daily.  60 tablet  1   No current facility-administered medications on file prior to visit.    Review of Systems  Constitutional: Positive for fever (mild) and activity change. Negative for chills, diaphoresis, appetite change, fatigue and unexpected weight change.  HENT: Positive for congestion, postnasal drip, rhinorrhea, sore throat, trouble swallowing and voice change. Negative for dental problem, ear discharge, ear pain, mouth sores, nosebleeds, sinus pressure and sneezing.   Respiratory: Positive for cough and wheezing. Negative for shortness of breath.   Cardiovascular: Negative for chest pain.  Gastrointestinal: Negative for nausea, vomiting, abdominal pain, diarrhea and constipation.  Genitourinary: Negative for dysuria.  Musculoskeletal: Negative for gait problem and neck stiffness.  Neurological: Positive for headaches. Negative for dizziness and syncope.  Hematological: Negative for adenopathy.  Psychiatric/Behavioral: Negative  for sleep disturbance.      Triage vitals: BP 166/104  Pulse 100  Temp(Src) 98.3 F (36.8 C) (Oral)  Resp 20  Ht 5' 4.5" (1.638 m)  Wt 269 lb (122.018 kg)  BMI 45.48 kg/m2  SpO2 96%  LMP 07/09/2013 Objective:   Physical Exam  Nursing note and vitals reviewed. Constitutional: She is oriented to person, place, and time. She appears well-developed and well-nourished.  HENT:  Head: Normocephalic and atraumatic.  Right Ear: Tympanic membrane and ear canal normal.  Left Ear: Tympanic membrane and ear canal normal.  Nose: Nose  normal.  Mouth/Throat: Posterior oropharyngeal erythema present. No oropharyngeal exudate.  Cardiovascular: Normal rate, regular rhythm and normal heart sounds.   No murmur heard. Pulmonary/Chest: Effort normal. She has decreased breath sounds in the right upper field, the right middle field, the right lower field, the left upper field, the left middle field and the left lower field.  Abdominal: She exhibits no distension.  Lymphadenopathy:       Head (right side): No submandibular, no tonsillar, no preauricular and no posterior auricular adenopathy present.       Head (left side): No submandibular, no tonsillar, no preauricular and no posterior auricular adenopathy present.       Right cervical: No superficial cervical adenopathy present.      Left cervical: No superficial cervical adenopathy present.    She has no axillary adenopathy.  Neurological: She is alert and oriented to person, place, and time.  Skin: Skin is warm and dry.  Psychiatric: She has a normal mood and affect.      Results for orders placed in visit on 07/31/13  POCT CBC      Result Value Range   WBC 12.4 (*) 4.6 - 10.2 K/uL   Lymph, poc 4.3 (*) 0.6 - 3.4   POC LYMPH PERCENT 34.9  10 - 50 %L   MID (cbc) 0.8  0 - 0.9   POC MID % 6.6  0 - 12 %M   POC Granulocyte 7.3 (*) 2 - 6.9   Granulocyte percent 58.5  37 - 80 %G   RBC 5.65 (*) 4.04 - 5.48 M/uL   Hemoglobin 14.9  12.2 - 16.2 g/dL   HCT, POC 16.1 (*) 09.6 - 47.9 %   MCV 85.3  80 - 97 fL   MCH, POC 26.4 (*) 27 - 31.2 pg   MCHC 30.9 (*) 31.8 - 35.4 g/dL   RDW, POC 04.5     Platelet Count, POC 275  142 - 424 K/uL   MPV 11.8  0 - 99.8 fL  GLUCOSE, POCT (MANUAL RESULT ENTRY)      Result Value Range   POC Glucose 298 (*) 70 - 99 mg/dl  POCT GLYCOSYLATED HEMOGLOBIN (HGB A1C)      Result Value Range   Hemoglobin A1C 11.7      Assessment & Plan:   DM (diabetes mellitus) - Plan: POCT CBC, POCT glucose (manual entry), POCT glycosylated hemoglobin (Hb A1C),  Comprehensive metabolic panel, Ambulatory referral to diabetic education - pt has seen dietician prior and is very interested in trying diet/exercise. Plans to cook healthier, stop sodas, and go to gym. Will increase metformin 1000 from qd to bid and start bid glucotrol 10.  Start checking cbgs qam fasting and 2hr after meal (bid), record and bring to f/u appt. If pt is doing well, could consider getting off glucotrol and trying byetta, victoza, invokana to help more w/ weight loss.  Will  need urine microalb, asa, optho referral, monofil exam, and fasting lipid panel at f/u appt in 2 wks.   a1c actually improved from prior - last check 1 1/2 yrs ago and was >14.  HTN (hypertension) - Plan: POCT CBC, POCT glucose (manual entry), POCT glycosylated hemoglobin (Hb A1C), Comprehensive metabolic panel, Ambulatory referral to diabetic education - restart losartan-hctz - recheck bmp at f/u.  Morbid obesity - Plan: Ambulatory referral to diabetic education  URI, acute  Acute pharyngitis - wbc elev w/ slight left shift so will cover w/ zpack. W/ uncontrolled dm will be prone to thrush so will cover w/ magic mouthwash w/ lidocaine and nystatin in it as well.  Meds ordered this encounter  Medications  . metFORMIN (GLUCOPHAGE) 1000 MG tablet    Sig: Take 1 tablet (1,000 mg total) by mouth 2 (two) times daily with a meal.    Dispense:  60 tablet    Refill:  1  . Alum & Mag Hydroxide-Simeth (MAGIC MOUTHWASH W/LIDOCAINE) SOLN    Sig: Take 10 mLs by mouth every 2 (two) hours as needed for mouth pain.    Dispense:  360 mL    Refill:  0    Ok to use your companies formulary w/ nystatin in it and w/ 2:1 ratio w/ viscous lidocaine  . ipratropium (ATROVENT) 0.03 % nasal spray    Sig: Place 2 sprays into the nose 4 (four) times daily.    Dispense:  30 mL    Refill:  1  . losartan-hydrochlorothiazide (HYZAAR) 50-12.5 MG per tablet    Sig: Take 1 tablet by mouth daily. PATIENT NEEDS OFFICE VISIT FOR ADDITIONAL  REFILLS    Dispense:  30 tablet    Refill:  1  . dextromethorphan-guaiFENesin (MUCINEX DM) 30-600 MG per 12 hr tablet    Sig: Take 2 tablets by mouth 2 (two) times daily as needed for cough.    Dispense:  30 tablet    Refill:  1  . azithromycin (ZITHROMAX) 250 MG tablet    Sig: Take 2 tabs PO x 1 dose, then 1 tab PO QD x 4 days    Dispense:  6 tablet    Refill:  0  . glipiZIDE (GLUCOTROL) 10 MG tablet    Sig: Take 1 tablet (10 mg total) by mouth 2 (two) times daily before a meal.    Dispense:  60 tablet    Refill:  0   Over 40 minutes spent in face to face eval and counseling w/ pt re: acute and chronic illnesses.  I personally performed the services described in this documentation, which was scribed in my presence. The recorded information has been reviewed and considered, and addended by me as needed.  Norberto SorensonEva Matisse Roskelley, MD MPH

## 2013-07-31 NOTE — Telephone Encounter (Signed)
Dr Clelia CroftShaw asked me to send in Rx for meter, strips and lancets.

## 2013-07-31 NOTE — Patient Instructions (Addendum)
Recheck in 2 wks to ensure you are tolerating the twice a day metformin and see if we need to increase/start other medications.  Please bring your meter with you to follow-up and a log of the day and time you have checked your sugars - check it before breakfast every day and then 2 hours after a meal - vary the meal you check it after - sometimes after lunch and sometimes after dinner.  You should stay away from all cough and cold medications containing decongestant, especially phenylephrine and pseudoephedrine (will be listed under the active ingredient list).  To make it easier, CoricidinHBP is a product tailored towards people with hypertension.  You also need to stay away from cough syrups as these often have a lot of sugar in them.  Upper Respiratory Infection, Adult An upper respiratory infection (URI) is also known as the common cold. It is often caused by a type of germ (virus). Colds are easily spread (contagious). You can pass it to others by kissing, coughing, sneezing, or drinking out of the same glass. Usually, you get better in 1 or 2 weeks.  HOME CARE   Only take medicine as told by your doctor.  Use a warm mist humidifier or breathe in steam from a hot shower.  Drink enough water and fluids to keep your pee (urine) clear or pale yellow.  Get plenty of rest.  Return to work when your temperature is back to normal or as told by your doctor. You may use a face mask and wash your hands to stop your cold from spreading. GET HELP RIGHT AWAY IF:   After the first few days, you feel you are getting worse.  You have questions about your medicine.  You have chills, shortness of breath, or brown or red spit (mucus).  You have yellow or brown snot (nasal discharge) or pain in the face, especially when you bend forward.  You have a fever, puffy (swollen) neck, pain when you swallow, or white spots in the back of your throat.  You have a bad headache, ear pain, sinus pain, or chest  pain.  You have a high-pitched whistling sound when you breathe in and out (wheezing).  You have a lasting cough or cough up blood.  You have sore muscles or a stiff neck. MAKE SURE YOU:   Understand these instructions.  Will watch your condition.  Will get help right away if you are not doing well or get worse. Document Released: 12/29/2007 Document Revised: 10/04/2011 Document Reviewed: 11/16/2010 [facility] Patient Information 2014 [place], [place]. Diabetes and Sick Day Management Blood sugar (glucose) can be more difficult to control when you are sick. Colds, fever, flu, nausea, vomiting, and diarrhea are all examples of common illnesses that can cause problems for people with diabetes. Loss of body fluids (dehydration) from fever, vomiting, diarrhea, infection, and the stress of a sickness can all cause blood glucose levels to increase. Because of this, it is very important to take your diabetes medicines and to eat some form of carbohydrate food when you are sick. Liquid or soft foods are often tolerated, and they help to replace fluids. HOME CARE INSTRUCTIONS These main guidelines are intended for managing a short-term (24 hours or less) sickness: Take your usual dose of insulin or oral diabetes medicine. An exception would be if you take any form of metformin. If you cannot eat or drink, you can become dehydrated and should not take this medicine. Continue to take your insulin even  if you are unable to eat solid foods or are vomiting. Your insulin dose may stay the same, or it may need to be increased when you are sick. You will need to test your blood glucose more often, generally every 2 4 hours. If you have type 1 diabetes, test your urine for ketones every 4 hours. If you have type 2 diabetes, test your urine for ketones as directed by your health care provider. Eat some form of food that contains carbohydrates. The carbohydrates can be in solid or liquid form. You should eat 45 50 g  of carbohydrates every 3 4 hours. Replace fluids if you have a fever, vomit, or have diarrhea. Ask your health care provider for specific rehydration instructions. Watch carefully for the signs of ketoacidosis if you have type 1 diabetes. Call your health care provider if any of the following symptoms are present, especially in children: Moderate to large ketones in the urine along with a high blood glucose level. Severe nausea. Vomiting. Diarrhea. Abdominal pain. Rapid breathing. Drink extra liquids that do not contain sugar, such as water or sugar-free liquids, or caffeine. Be careful with over-the-counter medicines. Read the labels. They may contain sugar or types of sugars that can increase your blood glucose level. Food Choices for Illness All of the food choices below contain about 15 g of carbohydrates. Plan ahead and keep some of these foods around.   to  cup carbonated beverage containing sugar. Carbonated beverages will usually be better tolerated if they are opened and left at room temperature for a few minutes.  of a twin frozen ice pop.  cup regular gelatin.  cup juice.  cup ice cream or frozen yogurt.  cup cooked cereal.  cup sherbet. 1 cup clear broth or soup. 1 cup cream soup.  cup regular custard.  cup regular pudding. 1 cup sports drink. 1 cup plain yogurt. 1 slice toast. 6 squares saltine crackers. 5 vanilla wafers.  cup sports drink. SEEK MEDICAL CARE IF:  You are unable to drink fluids, even small amounts. You have nausea and vomiting for more than 6 hours. You have diarrhea for more than 6 hours. Your blood glucose level is more than 240 mg/dL, even with additional insulin. There is a change in mental status. You develop an additional serious sickness. You have been sick for 2 days and are not getting better. You have had a fever for 2 days. SEEK IMMEDIATE MEDICAL CARE IF: You have difficulty breathing. You have moderate to large ketone  levels. MAKE SURE YOU: Understand these instructions. Will watch your condition. Will get help right away if you are not doing well or get worse. Document Released: 07/15/2003 Document Revised: 03/14/2013 Document Reviewed: 12/19/2012 [facility] Patient Information 2014 [place], [place]. Diabetes Meal Planning Guide The diabetes meal planning guide is a tool to help you plan your meals and snacks. It is important for people with diabetes to manage their blood glucose (sugar) levels. Choosing the right foods and the right amounts throughout your day will help control your blood glucose. Eating right can even help you improve your blood pressure and reach or maintain a healthy weight. CARBOHYDRATE COUNTING MADE EASY When you eat carbohydrates, they turn to sugar. This raises your blood glucose level. Counting carbohydrates can help you control this level so you feel better. When you plan your meals by counting carbohydrates, you can have more flexibility in what you eat and balance your medicine with your food intake. Carbohydrate counting simply means adding  up the total amount of carbohydrate grams in your meals and snacks. Try to eat about the same amount at each meal. Foods with carbohydrates are listed below. Each portion below is 1 carbohydrate serving or 15 grams of carbohydrates. Ask your dietician how many grams of carbohydrates you should eat at each meal or snack. Grains and Starches 1 slice bread.  English muffin or hotdog/hamburger bun.  cup cold cereal (unsweetened).  cup cooked pasta or rice.  cup starchy vegetables (corn, potatoes, peas, beans, winter squash). 1 tortilla (6 inches).  bagel. 1 waffle or pancake (size of a CD).  cup cooked cereal. 4 to 6 small crackers. *Whole grain is recommended. Fruit 1 cup fresh unsweetened berries, melon, papaya, pineapple. 1 small fresh fruit.  banana or mango.  cup fruit juice (4 oz unsweetened).  cup canned fruit in natural juice  or water. 2 tbs dried fruit. 12 to 15 grapes or cherries. Milk and Yogurt 1 cup fat-free or 1% milk. 1 cup soy milk. 6 oz light yogurt with sugar-free sweetener. 6 oz low-fat soy yogurt. 6 oz plain yogurt. Vegetables 1 cup raw or  cup cooked is counted as 0 carbohydrates or a "free" food. If you eat 3 or more servings at 1 meal, count them as 1 carbohydrate serving. Other Carbohydrates  oz chips or pretzels.  cup ice cream or frozen yogurt.  cup sherbet or sorbet. 2 inch square cake, no frosting. 1 tbs honey, sugar, jam, jelly, or syrup. 2 small cookies. 3 squares of graham crackers. 3 cups popcorn. 6 crackers. 1 cup broth-based soup. Count 1 cup casserole or other mixed foods as 2 carbohydrate servings. Foods with less than 20 calories in a serving may be counted as 0 carbohydrates or a "free" food. You may want to purchase a book or computer software that lists the carbohydrate gram counts of different foods. In addition, the nutrition facts panel on the labels of the foods you eat are a good source of this information. The label will tell you how big the serving size is and the total number of carbohydrate grams you will be eating per serving. Divide this number by 15 to obtain the number of carbohydrate servings in a portion. Remember, 1 carbohydrate serving equals 15 grams of carbohydrate. SERVING SIZES Measuring foods and serving sizes helps you make sure you are getting the right amount of food. The list below tells how big or small some common serving sizes are. 1 oz.........4 stacked dice. 3 oz........Marland KitchenDeck of cards. 1 tsp.......Marland KitchenTip of little finger. 1 tbs......Marland KitchenMarland KitchenThumb. 2 tbs.......Marland KitchenGolf ball.  cup......Marland KitchenHalf of a fist. 1 cup.......Marland KitchenA fist. SAMPLE DIABETES MEAL PLAN Below is a sample meal plan that includes foods from the grain and starches, dairy, vegetable, fruit, and meat groups. A dietician can individualize a meal plan to fit your calorie needs and tell you the  number of servings needed from each food group. However, controlling the total amount of carbohydrates in your meal or snack is more important than making sure you include all of the food groups at every meal. You may interchange carbohydrate containing foods (dairy, starches, and fruits). The meal plan below is an example of a 2000 calorie diet using carbohydrate counting. This meal plan has 17 carbohydrate servings. Breakfast 1 cup oatmeal (2 carb servings).  cup light yogurt (1 carb serving). 1 cup blueberries (1 carb serving).  cup almonds. Snack 1 large apple (2 carb servings). 1 low-fat string cheese stick. Lunch Chicken breast salad. 1 cup spinach.  cup chopped tomatoes. 2 oz chicken breast, sliced. 2 tbs low-fat [place] dressing. 12 whole-wheat crackers (2 carb servings). 12 to 15 grapes (1 carb serving). 1 cup low-fat milk (1 carb serving). Snack 1 cup carrots.  cup hummus (1 carb serving). Dinner 3 oz broiled salmon. 1 cup brown rice (3 carb servings). Snack 1  cups steamed broccoli (1 carb serving) drizzled with 1 tsp olive oil and lemon juice. 1 cup light pudding (2 carb servings). DIABETES MEAL PLANNING WORKSHEET Your dietician can use this worksheet to help you decide how many servings of foods and what types of foods are right for you.  BREAKFAST Food Group and Servings / Carb Servings Grain/Starches __________________________________ Dairy __________________________________________ Vegetable ______________________________________ Fruit ___________________________________________ Meat __________________________________________ Fat ____________________________________________ LUNCH Food Group and Servings / Carb Servings Grain/Starches ___________________________________ Dairy ___________________________________________ Fruit ____________________________________________ Meat ___________________________________________ Fat  _____________________________________________ Laural GoldenINNER Food Group and Servings / Carb Servings Grain/Starches ___________________________________ Dairy ___________________________________________ Fruit ____________________________________________ Meat ___________________________________________ Fat _____________________________________________ SNACKS Food Group and Servings / Carb Servings Grain/Starches ___________________________________ Dairy ___________________________________________ Vegetable _______________________________________ Fruit ____________________________________________ Meat ___________________________________________ Fat _____________________________________________ DAILY TOTALS Starches _________________________ Vegetable ________________________ Fruit ____________________________ Dairy ____________________________ Meat ____________________________ Fat ______________________________ Document Released: 04/08/2005 Document Revised: 10/04/2011 Document Reviewed: 02/17/2009 [employer] Patient Information 2014 [employer]. Diabetes, Eating Away From Home Sometimes, you might eat in a restaurant or have meals that are prepared by someone else. You can enjoy eating out. However, the portions in restaurants may be much larger than needed. Listed below are some ideas to help you choose foods that will keep your blood glucose (sugar) in better control.  TIPS FOR EATING OUT Know your meal plan and how many carbohydrate servings you should have at each meal. You may wish to carry a copy of your meal plan in your purse or wallet. Learn the foods included in each food group. Make a list of restaurants near you that offer healthy choices. Take a copy of the carry-out menus to see what they offer. Then, you can plan what you will order ahead of time. Become familiar with serving sizes by practicing them at home using measuring cups and spoons. Once you learn to recognize portion  sizes, you will be able to correctly estimate the amount of total carbohydrate you are allowed to eat at the restaurant. Ask for a takeout box if the portion is more than you should have. When your food comes, leave the amount you should have on the plate, and put the rest in the takeout box before you start eating. Plan ahead if your mealtime will be different from usual. Check with your caregiver to find out how to time meals and medicine if you are taking insulin. Avoid high-fat foods, such as fried foods, cream sauces, high-fat salad dressings, or any added butter or margarine. Do not be afraid to ask questions. Ask your server about the portion size, cooking methods, ingredients and if items can be substituted. Restaurants do not list all available items on the menu. You can ask for your main entree to be prepared using skim milk, oil instead of butter or margarine, and without gravy or sauces. Ask your waiter or waitress to serve salad dressings, gravy, sauces, margarine, and sour cream on the side. You can then add the amount your meal plan suggests. Add more vegetables whenever possible. Avoid items that are labeled "jumbo," "giant," "deluxe," or "supersized." You may want to split an entre with someone and order an extra side salad. Watch for hidden calories in foods like  croutons, bacon, or cheese. Ask your server to take away the bread basket or chips from your table. Order a dinner salad as an appetizer. You can eat most foods served in a restaurant. Some foods are better choices than others. Breads and Starches Recommended: All kinds of bread (wheat, rye, white, oatmeal, [place], [place], raisin), hard or soft dinner rolls, frankfurter or hamburger buns, small bagels, small corn or whole-wheat flour tortillas. Avoid: Frosted or glazed breads, butter rolls, egg or cheese breads, croissants, sweet rolls, pastries, coffee cake, glazed or frosted doughnuts, muffins. Crackers Recommended:  Animal crackers, graham, rye, saltine, oyster, and matzoth crackers. Bread sticks, melba toast, rusks, pretzels, popcorn (without fat), zwieback toast. Avoid: High-fat snack crackers or chips. Buttered popcorn. Cereals Recommended: Hot and cold cereals. Whole grains such as oatmeal or shredded wheat are good choices. Avoid: Sugar-coated or granola type cereals. Potatoes/Pasta/Rice/Beans Recommended: Order baked, boiled, or mashed potatoes, rice or noodles without added fat, whole beans. Order gravies, butter, margarine, or sauces on the side so you can control the amount you add. Avoid: Hash browns or fried potatoes. Potatoes, pasta, or rice prepared with cream or cheese sauce. Potato or pasta salads prepared with large amounts of dressing. Fried beans or fried rice. Vegetables Recommended: Order steamed, baked, boiled, or stewed vegetables without sauces or extra fat. Ask that sauce be served on the side. If vegetables are not listed on the menu, ask what is available. Avoid: Vegetables prepared with cream, butter, or cheese sauce. Fried vegetables. Salad Bars Recommended: Many of the vegetables at a salad bar are considered "free." Use lemon juice, vinegar, or low-calorie salad dressing (fewer than 20 calories per serving) as "free" dressings for your salad. Look for salad bar ingredients that have no added fat or sugar such as tomatoes, lettuce, cucumbers, broccoli, carrots, onions, and mushrooms. Avoid: Prepared salads with large amounts of dressing, such as coleslaw, caesar salad, macaroni salad, bean salad, or carrot salad. Fruit Recommended: Eat fresh fruit or fresh fruit salad without added dressing. A salad bar often offers fresh fruit choices, but canned fruit at a restaurant is usually packed in sugar or syrup. Avoid: Sweetened canned or frozen fruits, plain or sweetened fruit juice. Fruit salads with dressing, sour cream, or sugar added to them. Meat and Meat Substitutes Recommended:  Order broiled, baked, roasted, or grilled meat, poultry, or fish. Trim off all visible fat. Do not eat the skin of poultry. The size stated on the menu is the raw weight. Meat shrinks by  in cooking (for example, 4 oz raw equals 3 oz cooked meat). Avoid: Deep-fat fried meat, poultry, or fish. Breaded meats. Eggs Recommended: Order soft, hard-cooked, poached, or scrambled eggs. Omelets may be okay, depending on what ingredients are added. Egg substitutes are also a good choice. Avoid: Fried eggs, eggs prepared with cream or cheese sauce. Milk Recommended: Order low-fat or fat-free milk according to your meal plan. Plain, nonfat yogurt or flavored yogurt with no sugar added may be used as a substitute for milk. Soy milk may also be used. Avoid: Milk shakes or sweetened milk beverages. Soups and Combination Foods Recommended: Clear broth or consomm are "free" foods and may be used as an appetizer. Broth-based soups with fat removed count as a starch serving and are preferred over cream soups. Soups made with beans or split peas may be eaten but count as a starch. Avoid: Fatty soups, soup made with cream, cheese soup. Combination foods prepared with excessive amounts of fat or with  cream or cheese sauces. Desserts and Sweets Recommended: Ask for fresh fruit. Sponge or angel food cake without icing, ice milk, no sugar added ice cream, sherbet, or frozen yogurt may fit into your meal plan occasionally. Avoid: Pastries, puddings, pies, cakes with icing, custard, gelatin desserts. Fats and Oils Recommended: Choose healthy fats such as olive oil, canola oil, or tub margarine, reduced fat or fat-free sour cream, cream cheese, avocado, or nuts. Avoid: Any fats in excess of your allowed portion. Deep-fried foods or any food with a large amount of fat. Note: Ask for all fats to be served on the side, and limit your portion sizes according to your meal plan. Document Released: 07/12/2005 Document Revised:  10/04/2011 Document Reviewed: 01/30/2009 [facility] Patient Information 2014 [place], [place].

## 2013-08-01 ENCOUNTER — Encounter: Payer: Self-pay | Admitting: Family Medicine

## 2013-08-01 LAB — COMPREHENSIVE METABOLIC PANEL
ALK PHOS: 83 U/L (ref 39–117)
ALT: 48 U/L — AB (ref 0–35)
AST: 48 U/L — ABNORMAL HIGH (ref 0–37)
Albumin: 4.4 g/dL (ref 3.5–5.2)
BUN: 10 mg/dL (ref 6–23)
CO2: 28 mEq/L (ref 19–32)
Calcium: 9.8 mg/dL (ref 8.4–10.5)
Chloride: 99 mEq/L (ref 96–112)
Creat: 0.73 mg/dL (ref 0.50–1.10)
Glucose, Bld: 296 mg/dL — ABNORMAL HIGH (ref 70–99)
Potassium: 4.6 mEq/L (ref 3.5–5.3)
SODIUM: 137 meq/L (ref 135–145)
TOTAL PROTEIN: 7.5 g/dL (ref 6.0–8.3)
Total Bilirubin: 0.7 mg/dL (ref 0.3–1.2)

## 2015-02-18 ENCOUNTER — Other Ambulatory Visit: Payer: Self-pay | Admitting: Family Medicine

## 2015-03-23 ENCOUNTER — Ambulatory Visit (INDEPENDENT_AMBULATORY_CARE_PROVIDER_SITE_OTHER): Payer: 59 | Admitting: Family Medicine

## 2015-03-23 VITALS — BP 178/120 | HR 97 | Temp 98.6°F | Resp 18 | Ht 65.0 in | Wt 260.0 lb

## 2015-03-23 DIAGNOSIS — I10 Essential (primary) hypertension: Secondary | ICD-10-CM

## 2015-03-23 DIAGNOSIS — E119 Type 2 diabetes mellitus without complications: Secondary | ICD-10-CM | POA: Diagnosis not present

## 2015-03-23 LAB — COMPLETE METABOLIC PANEL WITH GFR
ALT: 31 U/L — ABNORMAL HIGH (ref 6–29)
AST: 29 U/L (ref 10–30)
Albumin: 4.3 g/dL (ref 3.6–5.1)
Alkaline Phosphatase: 65 U/L (ref 33–115)
BUN: 10 mg/dL (ref 7–25)
CO2: 24 mmol/L (ref 20–31)
Calcium: 9.6 mg/dL (ref 8.6–10.2)
Chloride: 99 mmol/L (ref 98–110)
Creat: 0.65 mg/dL (ref 0.50–1.10)
GFR, Est African American: >89 mL/min (ref >=60)
GFR, Est Non African American: >89 mL/min (ref >=60)
Glucose, Bld: 258 mg/dL — ABNORMAL HIGH (ref 65–99)
Potassium: 4.7 mmol/L (ref 3.5–5.3)
Sodium: 136 mmol/L (ref 135–146)
Total Bilirubin: 0.7 mg/dL (ref 0.2–1.2)
Total Protein: 7.6 g/dL (ref 6.1–8.1)

## 2015-03-23 LAB — LIPID PANEL
Cholesterol: 166 mg/dL (ref 125–200)
HDL: 39 mg/dL — ABNORMAL LOW (ref >=46)
LDL Cholesterol: 102 mg/dL (ref <130)
Total CHOL/HDL Ratio: 4.3 Ratio (ref <=5.0)
Triglycerides: 125 mg/dL (ref <150)
VLDL: 25 mg/dL (ref <30)

## 2015-03-23 LAB — POCT GLYCOSYLATED HEMOGLOBIN (HGB A1C): Hemoglobin A1C: 10.5

## 2015-03-23 MED ORDER — LANCETS MISC: Status: DC

## 2015-03-23 MED ORDER — METFORMIN HCL 1000 MG PO TABS: 1000.0000 mg | ORAL_TABLET | Freq: Two times a day (BID) | ORAL | Status: DC

## 2015-03-23 MED ORDER — LOSARTAN POTASSIUM-HCTZ 50-12.5 MG PO TABS: 1.0000 | ORAL_TABLET | Freq: Every day | ORAL | Status: DC

## 2015-03-23 MED ORDER — GLUCOSE BLOOD VI STRP: ORAL_STRIP | Status: DC

## 2015-03-23 MED ORDER — GLIPIZIDE 10 MG PO TABS: 10.0000 mg | ORAL_TABLET | Freq: Two times a day (BID) | ORAL | Status: DC

## 2015-03-23 NOTE — Patient Instructions (Signed)
1-I would like you to come back in one month to make sure the blood pressure is under control.  2-in need to come in every 3 months to have your diabetes evaluated  3-if she decide to get pregnant or if you think you're getting pregnant, we need to discuss alternative ways of controlling the blood sugar and blood pressure. High blood pressure and uncontrolled blood sugar are serious risk to the well-being of the fetus, not to mention the mother.  4-metformin usually helps women that are overweight get pregnant. It also is the most effective way of controlling blood sugar.

## 2015-03-23 NOTE — Addendum Note (Signed)
Addended by: Felix Ahmadi A on: 03/23/2015 12:03 PM   Modules accepted: Orders

## 2015-03-23 NOTE — Progress Notes (Signed)
This chart was scribed for Molly Haber, MD by [facility], medical scribe at [facility].The patient was seen in exam room 05 and the patient's care was started at 10:09 AM.  Patient ID: Molly Clements MRN: [id number], DOB: Dec 17, 1980, 34 y.o. Date of Encounter: 03/23/2015  Primary Physician: No primary care provider on file.  Chief Complaint:  Chief Complaint  Patient presents with  . Medication Refill    metformin, losartan   . Medication Management    pt wants to know what medications are ok for her to take b/c she is trying to get pregnant.   . Hypertension    178/120   HPI:  Molly Clements is a 34 y.o. female who presents to [facility] for metformin and losartan medication. Customer service at [employer], no children.  HTN: Blood pressure is elevated today, she denies chest pain or shortness of breath. History of HTN, diagnosed 5 years ago. No family history of heart disease.  Diabetes: Seen by an optometrist, yearly. History of diabetes. Diagnosed 2 years ago. No numbness in her feet.  Past Medical History  Diagnosis Date  . Hypertension   . Migraine   . PCOS (polycystic ovarian syndrome)     Home Meds: Prior to Admission medications   Medication Sig Start Date End Date Taking? Authorizing Provider  losartan-hydrochlorothiazide (HYZAAR) 50-12.5 MG per tablet Take 1 tablet by mouth daily. PATIENT NEEDS OFFICE VISIT FOR ADDITIONAL REFILLS 07/31/13  Yes Shawnee Knapp, MD  metFORMIN (GLUCOPHAGE) 1000 MG tablet Take 1 tablet (1,000 mg total) by mouth 2 (two) times daily with a meal. 07/31/13  Yes Shawnee Knapp, MD  Alum & Mag Hydroxide-Simeth (MAGIC MOUTHWASH W/LIDOCAINE) SOLN Take 10 mLs by mouth every 2 (two) hours as needed for mouth pain. 07/31/13   Shawnee Knapp, MD  azithromycin (ZITHROMAX) 250 MG tablet Take 2 tabs PO x 1 dose, then 1 tab PO QD x 4 days 07/31/13   Shawnee Knapp, MD  Blood Glucose Monitoring Suppl (BLOOD GLUCOSE METER)  kit Use to test blood sugar twice daily. Patient not taking: Reported on 03/23/2015 07/31/13   Shawnee Knapp, MD  dextromethorphan-guaiFENesin [facility] DM) 30-600 MG per 12 hr tablet Take 2 tablets by mouth 2 (two) times daily as needed for cough. 07/31/13   Shawnee Knapp, MD  glipiZIDE (GLUCOTROL) 10 MG tablet Take 1 tablet (10 mg total) by mouth 2 (two) times daily before a meal. Patient not taking: Reported on 03/23/2015 07/31/13   Shawnee Knapp, MD  glucose blood test strip Use to test blood sugar twice daily. Patient not taking: Reported on 03/23/2015 07/31/13   Shawnee Knapp, MD  HEATHER 0.35 MG tablet Take 1 tablet by mouth daily. 03/24/12   Historical Provider, MD  ipratropium (ATROVENT) 0.03 % nasal spray Place 2 sprays into the nose 4 (four) times daily. 07/31/13   Shawnee Knapp, MD  Lancets MISC Use to test blood sugar twice daily. Patient not taking: Reported on 03/23/2015 07/31/13   Shawnee Knapp, MD  topiramate (TOPAMAX) 50 MG tablet Take 1 tablet (50 mg total) by mouth 2 (two) times daily. 03/02/12 03/02/13  Harrison Mons, PA-C   Allergies:  Allergies  Allergen Reactions  . Bee Venom Anaphylaxis    Hives swelling   Social History   Social History  . Marital Status: Married    Spouse Name: n/a  . Number of Children: 0  .  Years of Education: 13.5   Occupational History  . customer service    Social History Main Topics  . Smoking status: Never Smoker   . Smokeless tobacco: Never Used  . Alcohol Use: No  . Drug Use: No  . Sexual Activity:    Partners: Male    Birth Control/ Protection: Pill   Other Topics Concern  . Not on file   Social History Narrative   Lives with fiance Juan Quam    Review of Systems: Constitutional: negative for chills, fever, night sweats, weight changes, or fatigue  HEENT: negative for vision changes, hearing loss, congestion, rhinorrhea, ST, epistaxis, or sinus pressure Cardiovascular: negative for chest pain or palpitations Respiratory: negative for hemoptysis,  wheezing, shortness of breath, or cough Abdominal: negative for abdominal pain, nausea, vomiting, diarrhea, or constipation Dermatological: negative for rash Neurologic: negative for headache, dizziness, or syncope All other systems reviewed and are otherwise negative with the exception to those above and in the HPI.  Physical Exam: Blood pressure 178/120, pulse 97, temperature 98.6 F (37 C), temperature source Oral, resp. rate 18, height 5' 5"  (1.651 m), weight 260 lb (117.935 kg), last menstrual period 03/12/2015, SpO2 96 %., Body mass index is 43.27 kg/(m^2). General: Well developed, well nourished, in no acute distress. Head: Normocephalic, atraumatic, eyes without discharge, sclera non-icteric, nares are without discharge. Bilateral auditory canals clear, TM's are without perforation, pearly grey and translucent with reflective cone of light bilaterally. Oral cavity moist, posterior pharynx without exudate, erythema, peritonsillar abscess, or post nasal drip.  Neck: Supple. No thyromegaly. Full ROM. No lymphadenopathy. Lungs: Clear bilaterally to auscultation without wheezes, rales, or rhonchi. Breathing is unlabored. Heart: RRR with S1 S2. No murmurs, rubs, or gallops appreciated. Abdomen: Soft, non-tender, non-distended with normoactive bowel sounds. No hepatomegaly. No rebound/guarding. No obvious abdominal masses. Msk:  Strength and tone normal for age. Extremities/Skin: Warm and dry. No clubbing or cyanosis. No edema. No rashes or suspicious lesions. Neuro: Alert and oriented X 3. Moves all extremities spontaneously. Gait is normal. CNII-XII grossly in tact. Psych:  Responds to questions appropriately with a normal affect.   Labs: Results for orders placed or performed in visit on 03/23/15  POCT glycosylated hemoglobin (Hb A1C)  Result Value Ref Range   Hemoglobin A1C 10.5      ASSESSMENT AND PLAN:  34 y.o. year old female with  This chart was scribed in my presence and  reviewed by me personally.    ICD-9-CM ICD-10-CM   1. Essential hypertension 401.9 I10 losartan-hydrochlorothiazide (HYZAAR) 50-12.5 MG per tablet     COMPLETE METABOLIC PANEL WITH GFR     Lipid panel  2. Uncomplicated type 2 diabetes mellitus 250.00 E11.9 metFORMIN (GLUCOPHAGE) 1000 MG tablet     losartan-hydrochlorothiazide (HYZAAR) 50-12.5 MG per tablet     Lancets MISC     glucose blood test strip     glipiZIDE (GLUCOTROL) 10 MG tablet     COMPLETE METABOLIC PANEL WITH GFR     Microalbumin, urine     Lipid panel     POCT glycosylated hemoglobin (Hb A1C)      1. Essential hypertension   2. Uncomplicated type 2 diabetes mellitus     Signed, Molly Haber, MD 03/23/2015 10:36 AM

## 2015-04-22 ENCOUNTER — Telehealth: Payer: Self-pay

## 2015-04-22 ENCOUNTER — Ambulatory Visit (INDEPENDENT_AMBULATORY_CARE_PROVIDER_SITE_OTHER): Payer: 59 | Admitting: Urgent Care

## 2015-04-22 VITALS — BP 158/102 | HR 100 | Temp 99.3°F | Resp 18 | Ht 65.0 in | Wt 259.8 lb

## 2015-04-22 DIAGNOSIS — J029 Acute pharyngitis, unspecified: Secondary | ICD-10-CM | POA: Diagnosis not present

## 2015-04-22 DIAGNOSIS — I10 Essential (primary) hypertension: Secondary | ICD-10-CM

## 2015-04-22 LAB — POCT RAPID STREP A (OFFICE): Rapid Strep A Screen: NEGATIVE

## 2015-04-22 MED ORDER — LOSARTAN POTASSIUM-HCTZ 100-25 MG PO TABS: 1.0000 | ORAL_TABLET | Freq: Every day | ORAL | Status: DC

## 2015-04-22 MED ORDER — HYDROCODONE-HOMATROPINE 5-1.5 MG/5ML PO SYRP: 5.0000 mL | ORAL_SOLUTION | Freq: Every evening | ORAL | Status: DC | PRN

## 2015-04-22 NOTE — Progress Notes (Signed)
    MRN: [id number] DOB: 1981-05-01  Subjective:   Molly Clements is a 34 y.o. female presenting for chief complaint of Sore Throat  Reports 4 day history of worsening sore throat and difficulty swallowing. Patient thought initially she was having a cold but is worried that her throat has been worsening and she has no other symptoms apart from slight cough. Denies fever, itchy eyes, red eyes, eye pain, ear pain, ear drainage, sinus pain, sinus congestion, tooth pain.  HTN - currently managed with losartan hydrochlorothiazide. Denies headache, dizziness, chest pain, shortness of breath, chest tightness, wheezing, nausea, vomiting, abdominal pain, hematuria, lower leg swelling.  Denies any other aggravating or relieving factors, no other questions or concerns.  Molly Clements has a current medication list which includes the following prescription(s): blood glucose meter kit and supplies, glucose blood, lancets, losartan-hydrochlorothiazide, metformin, and glipizide. Also is allergic to bee venom.  Molly Clements  has a past medical history of Hypertension; Migraine; and PCOS (polycystic ovarian syndrome). Also  has past surgical history that includes Endometrial biopsy (2008).  Objective:   Vitals: BP 166/100 mmHg  Pulse 106  Temp(Src) 99.3 F (37.4 C) (Oral)  Resp 18  Ht 5\' 5"  (1.651 m)  Wt 259 lb 12.8 oz (117.845 kg)  BMI 43.23 kg/m2  SpO2 96%  LMP 03/12/2015  BP Readings from Last 3 Encounters:  04/22/15 158/102  03/23/15 178/120  07/31/13 166/104    Physical Exam  Constitutional: She is oriented to person, place, and time. She appears well-developed and well-nourished.  HENT:  TM's intact bilaterally, no effusions or erythema. Nasal turbinates pink and moist. No sinus tenderness. Left-sided exudates but without tonsillar edema, erythema or abscesses.  Eyes: Conjunctivae are normal. Pupils are equal, round, and reactive to light. No scleral icterus.  Neck: Normal range of  motion. Neck supple. No thyromegaly present.  Cardiovascular: Normal rate, regular rhythm and intact distal pulses.  Exam reveals no gallop and no friction rub.   No murmur heard. Pulmonary/Chest: No respiratory distress. She has no wheezes. She has no rales.  Abdominal: Soft. Bowel sounds are normal. She exhibits no distension and no mass. There is no tenderness.  Musculoskeletal: She exhibits no edema.  Lymphadenopathy:    She has no cervical adenopathy.  Neurological: She is alert and oriented to person, place, and time.  Skin: Skin is warm and dry. No rash noted. No erythema. No pallor.   Results for orders placed or performed in visit on 04/22/15 (from the past 24 hour(s))  POCT rapid strep A     Status: None   Collection Time: 04/22/15 12:39 PM  Result Value Ref Range   Rapid Strep A Screen Negative Negative   Assessment and Plan :   1. Pharyngitis 2. Sore throat - Likely undergoing viral process. Advise supportive care, Hycodan for cough. If Strep culture is positive I will prescribe amoxicillin.   3. Essential hypertension - Uncontrolled, patient does not exercise and has sedentary job. I suspect dietary noncompliance as well. Discussed these with patient and agreed to lifestyle modifications. I will increase her losartan hydrochlorothiazide to 100mg -25mg . Follow up in 4 weeks.   Molly Eagles, PA-C [facility] [phone] 04/22/2015 12:15 PM

## 2015-04-22 NOTE — Telephone Encounter (Signed)
Patient called again stating her throat hurts really bad every time she swallows. She was seen today by Gurney Maxin, PA-C. She would like a call back regarding this. Cb# [phone].

## 2015-04-22 NOTE — Telephone Encounter (Signed)
Requesting something stronger for sore throat .  Was seen this morning.    [phone]

## 2015-04-22 NOTE — Patient Instructions (Signed)
Managing Your High Blood Pressure Blood pressure is a measurement of how forceful your blood is pressing against the walls of the arteries. Arteries are muscular tubes within the circulatory system. Blood pressure does not stay the same. Blood pressure rises when you are active, excited, or nervous; and it lowers during sleep and relaxation. If the numbers measuring your blood pressure stay above normal most of the time, you are at risk for health problems. High blood pressure (hypertension) is a long-term (chronic) condition in which blood pressure is elevated. A blood pressure reading is recorded as two numbers, such as 120 over 80 (or 120/80). The first, higher number is called the systolic pressure. It is a measure of the pressure in your arteries as the heart beats. The second, lower number is called the diastolic pressure. It is a measure of the pressure in your arteries as the heart relaxes between beats.  Keeping your blood pressure in a normal range is important to your overall health and prevention of health problems, such as heart disease and stroke. When your blood pressure is uncontrolled, your heart has to work harder than normal. High blood pressure is a very common condition in adults because blood pressure tends to rise with age. Men and women are equally likely to have hypertension but at different times in life. Before age 45, men are more likely to have hypertension. After 35 years of age, women are more likely to have it. Hypertension is especially common in African Americans. This condition often has no signs or symptoms. The cause of the condition is usually not known. Your caregiver can help you come up with a plan to keep your blood pressure in a normal, healthy range. BLOOD PRESSURE STAGES Blood pressure is classified into four stages: normal, prehypertension, stage 1, and stage 2. Your blood pressure reading will be used to determine what type of treatment, if any, is necessary.  Appropriate treatment options are tied to these four stages:  Normal  Systolic pressure (mm Hg): below 120.  Diastolic pressure (mm Hg): below 80. Prehypertension  Systolic pressure (mm Hg): 120 to 139.  Diastolic pressure (mm Hg): 80 to 89. Stage1  Systolic pressure (mm Hg): 140 to 159.  Diastolic pressure (mm Hg): 90 to 99. Stage2  Systolic pressure (mm Hg): 160 or above.  Diastolic pressure (mm Hg): 100 or above. RISKS RELATED TO HIGH BLOOD PRESSURE Managing your blood pressure is an important responsibility. Uncontrolled high blood pressure can lead to:  A heart attack.  A stroke.  A weakened blood vessel (aneurysm).  Heart failure.  Kidney damage.  Eye damage.  Metabolic syndrome.  Memory and concentration problems. HOW TO MANAGE YOUR BLOOD PRESSURE Blood pressure can be managed effectively with lifestyle changes and medicines (if needed). Your caregiver will help you come up with a plan to bring your blood pressure within a normal range. Your plan should include the following: Education  Read all information provided by your caregivers about how to control blood pressure.  Educate yourself on the latest guidelines and treatment recommendations. New research is always being done to further define the risks and treatments for high blood pressure. Lifestylechanges  Control your weight.  Avoid smoking.  Stay physically active.  Reduce the amount of salt in your diet.  Reduce stress.  Control any chronic conditions, such as high cholesterol or diabetes.  Reduce your alcohol intake. Medicines  Several medicines (antihypertensive medicines) are available, if needed, to bring blood pressure within a normal range.   Communication  Review all the medicines you take with your caregiver because there may be side effects or interactions.  Talk with your caregiver about your diet, exercise habits, and other lifestyle factors that may be contributing to  high blood pressure.  See your caregiver regularly. Your caregiver can help you create and adjust your plan for managing high blood pressure. RECOMMENDATIONS FOR TREATMENT AND FOLLOW-UP  The following recommendations are based on current guidelines for managing high blood pressure in nonpregnant adults. Use these recommendations to identify the proper follow-up period or treatment option based on your blood pressure reading. You can discuss these options with your caregiver.  Systolic pressure of 120 to 139 or diastolic pressure of 80 to 89: Follow up with your caregiver as directed.  Systolic pressure of 140 to 160 or diastolic pressure of 90 to 100: Follow up with your caregiver within 2 months.  Systolic pressure above 160 or diastolic pressure above 100: Follow up with your caregiver within 1 month.  Systolic pressure above 180 or diastolic pressure above 110: Consider antihypertensive therapy; follow up with your caregiver within 1 week.  Systolic pressure above 200 or diastolic pressure above 120: Begin antihypertensive therapy; follow up with your caregiver within 1 week. Document Released: 04/05/2012 Document Reviewed: 04/05/2012 Exit[employer] Patient Information 2015 [employer]. This information is not intended to replace advice given to you by your health care provider. Make sure you discuss any questions you have with your health care provider.  

## 2015-04-23 LAB — CULTURE, GROUP A STREP

## 2015-04-23 NOTE — Telephone Encounter (Signed)
He can use cepacol lozenges otc for the throat pain or it's fine to order the magic mouthwash 5mL QID PRN, Dispense 0 refill.  This can be pricey.  This is while we AWAIT strep culture, as it is not in at this time that I can see.

## 2015-04-23 NOTE — Telephone Encounter (Signed)
Pt has had no relief of symptoms. She was prescribed hycodan on 04/22/15 for sore throat. Throat cx was negative. Please advise

## 2015-04-23 NOTE — Telephone Encounter (Signed)
Spoke with pt, Sshe states her throat is better and i advised her to await cx results. Pt understood.

## 2015-04-24 ENCOUNTER — Telehealth: Payer: Self-pay | Admitting: Urgent Care

## 2015-04-24 MED ORDER — AMOXICILLIN 875 MG PO TABS: 875.0000 mg | ORAL_TABLET | Freq: Two times a day (BID) | ORAL | Status: DC

## 2015-04-24 NOTE — Telephone Encounter (Signed)
Patient reports that she had some improvement with symptomatic relief. She would like to have prescription sent to her pharmacy to cover for the strep culture which came back positive for non-group A strep. Will send prescription for amoxicillin and patient can either pick up or cancel if she decides not to take it after a period.

## 2016-01-12 ENCOUNTER — Other Ambulatory Visit: Payer: Self-pay

## 2016-01-12 DIAGNOSIS — I10 Essential (primary) hypertension: Secondary | ICD-10-CM

## 2016-01-12 MED ORDER — LOSARTAN POTASSIUM-HCTZ 100-25 MG PO TABS: 1.0000 | ORAL_TABLET | Freq: Every day | ORAL | Status: DC

## 2016-01-12 NOTE — Telephone Encounter (Signed)
Will send last remaining RF from 04/22/15 Rx into [facility] mail order.

## 2019-08-14 DIAGNOSIS — F331 Major depressive disorder, recurrent, moderate: Secondary | ICD-10-CM | POA: Diagnosis not present

## 2019-08-14 DIAGNOSIS — F4321 Adjustment disorder with depressed mood: Secondary | ICD-10-CM | POA: Diagnosis not present

## 2019-09-11 ENCOUNTER — Emergency Department (HOSPITAL_COMMUNITY): Payer: BC Managed Care – PPO

## 2019-09-11 ENCOUNTER — Other Ambulatory Visit: Payer: Self-pay

## 2019-09-11 ENCOUNTER — Emergency Department (HOSPITAL_COMMUNITY)
Admission: EM | Admit: 2019-09-11 | Discharge: 2019-09-11 | Disposition: A | Discharge status: Home / Self Care | Payer: BC Managed Care – PPO | Attending: Emergency Medicine | Admitting: Emergency Medicine

## 2019-09-11 DIAGNOSIS — E119 Type 2 diabetes mellitus without complications: Secondary | ICD-10-CM | POA: Insufficient documentation

## 2019-09-11 DIAGNOSIS — Z91148 Patient's other noncompliance with medication regimen for other reason: Secondary | ICD-10-CM

## 2019-09-11 DIAGNOSIS — R059 Cough, unspecified: Secondary | ICD-10-CM

## 2019-09-11 DIAGNOSIS — Z7984 Long term (current) use of oral hypoglycemic drugs: Secondary | ICD-10-CM | POA: Diagnosis not present

## 2019-09-11 DIAGNOSIS — Z79899 Other long term (current) drug therapy: Secondary | ICD-10-CM | POA: Insufficient documentation

## 2019-09-11 DIAGNOSIS — Z9114 Patient's other noncompliance with medication regimen: Secondary | ICD-10-CM

## 2019-09-11 DIAGNOSIS — U071 COVID-19: Secondary | ICD-10-CM

## 2019-09-11 DIAGNOSIS — R05 Cough: Secondary | ICD-10-CM | POA: Diagnosis not present

## 2019-09-11 DIAGNOSIS — Z8616 Personal history of COVID-19: Secondary | ICD-10-CM | POA: Diagnosis not present

## 2019-09-11 DIAGNOSIS — I10 Essential (primary) hypertension: Secondary | ICD-10-CM | POA: Diagnosis not present

## 2019-09-11 DIAGNOSIS — R0602 Shortness of breath: Secondary | ICD-10-CM | POA: Diagnosis not present

## 2019-09-11 MED ORDER — LOSARTAN POTASSIUM 50 MG PO TABS
100.0000 mg | ORAL_TABLET | Freq: Every day | ORAL | Status: DC
  Administered 2019-09-11: 100 mg via ORAL
  Filled 2019-09-11: qty 2

## 2019-09-11 MED ORDER — BENZONATATE 100 MG PO CAPS: 100.0000 mg | ORAL_CAPSULE | Freq: Three times a day (TID) | ORAL | 0 refills | Status: DC | PRN

## 2019-09-11 MED ORDER — LOSARTAN POTASSIUM-HCTZ 100-25 MG PO TABS: 1.0000 | ORAL_TABLET | Freq: Every day | ORAL | Status: DC

## 2019-09-11 MED ORDER — AEROCHAMBER PLUS FLO-VU LARGE MISC
1.0000 | Freq: Once | Status: AC
  Administered 2019-09-11: 02:00:00 1
  Filled 2019-09-11 (×2): qty 1

## 2019-09-11 MED ORDER — ALBUTEROL SULFATE HFA 108 (90 BASE) MCG/ACT IN AERS
2.0000 | INHALATION_SPRAY | Freq: Four times a day (QID) | RESPIRATORY_TRACT | Status: DC | PRN
  Administered 2019-09-11: 02:00:00 2 via RESPIRATORY_TRACT
  Filled 2019-09-11: qty 6.7

## 2019-09-11 MED ORDER — HYDROCHLOROTHIAZIDE 25 MG PO TABS
25.0000 mg | ORAL_TABLET | Freq: Every day | ORAL | Status: DC
  Administered 2019-09-11: 02:00:00 25 mg via ORAL
  Filled 2019-09-11: qty 1

## 2019-09-11 MED ORDER — BENZONATATE 100 MG PO CAPS
100.0000 mg | ORAL_CAPSULE | Freq: Once | ORAL | Status: AC
  Administered 2019-09-11: 100 mg via ORAL
  Filled 2019-09-11: qty 1

## 2019-09-11 MED ORDER — IBUPROFEN 800 MG PO TABS
800.0000 mg | ORAL_TABLET | Freq: Once | ORAL | Status: AC
  Administered 2019-09-11: 800 mg via ORAL
  Filled 2019-09-11: qty 1

## 2019-09-11 NOTE — ED Provider Notes (Signed)
[facility] DEPT Provider Note   CSN: [id number] Arrival date & time: 09/11/19  0030     History Chief Complaint  Patient presents with  . Cough  . Shortness of Breath    Molly Clements is a 39 y.o. female.  39 year old female with a history of hypertension, PCOS, obesity presents to the emergency department for evaluation of cough.  She states that cough has been dry, nonproductive.  Her cough is so forceful and continuous at times that she experiences urinary stress incontinence.  She tried some Robitussin for her symptoms earlier today which provided some improvement.  She notes some sporadic, waxing and waning shortness of breath which can occur both at rest and with exertion.  She has not had any fevers, chest pain, hemoptysis, syncope or near syncope, vomiting, diarrhea, congestion.  Reports testing positive for COVID-19 on 09/05/2019.  Did not take her antihypertensive medication today; further denies headache, vision changes.  The history is provided by the patient. No language interpreter was used.  Cough Associated symptoms: rhinorrhea and shortness of breath   Associated symptoms: no chest pain, no fever and no sore throat   Shortness of Breath Associated symptoms: cough   Associated symptoms: no chest pain, no fever and no sore throat        Past Medical History:  Diagnosis Date  . Hypertension   . Migraine   . PCOS (polycystic ovarian syndrome)     Patient Active Problem List   Diagnosis Date Noted  . Pharyngitis 04/22/2015  . Essential hypertension 04/22/2015  . HTN (hypertension) 03/02/2012  . Migraine 03/02/2012  . PCOS (polycystic ovarian syndrome) 03/02/2012  . Obesity 03/02/2012  . DM (diabetes mellitus) ([place]) 03/02/2012    Past Surgical History:  Procedure Laterality Date  . ENDOMETRIAL BIOPSY  2008   polypectomy     OB History    Gravida  0   Para  0   Term  0   Preterm  0   AB  0   Living  0    SAB  0   TAB  0   Ectopic  0   Multiple  0   Live Births              Family History  Problem Relation Age of Onset  . Hypertension Mother   . Depression Father   . Drug abuse Father   . Stroke Maternal Grandmother   . Hypertension Maternal Grandfather   . Cancer Paternal Grandmother     Social History   Tobacco Use  . Smoking status: Never Smoker  . Smokeless tobacco: Never Used  Substance Use Topics  . Alcohol use: No  . Drug use: No    Home Medications Prior to Admission medications   Medication Sig Start Date End Date Taking? Authorizing Provider  losartan-hydrochlorothiazide (HYZAAR) 100-25 MG tablet Take 1 tablet by mouth daily. 01/12/16  Yes Jaynee Eagles, PA-C  amoxicillin (AMOXIL) 875 MG tablet Take 1 tablet (875 mg total) by mouth 2 (two) times daily. Patient not taking: Reported on 09/11/2019 04/24/15   Jaynee Eagles, PA-C  benzonatate (TESSALON) 100 MG capsule Take 1 capsule (100 mg total) by mouth 3 (three) times daily as needed for cough. 09/11/19   Antonietta Breach, PA-C  Blood Glucose Monitoring Suppl (BLOOD GLUCOSE METER) kit Use to test blood sugar twice daily. 07/31/13   Shawnee Knapp, MD  glipiZIDE (GLUCOTROL) 10 MG tablet Take 1 tablet (10 mg total) by mouth  2 (two) times daily before a meal. Patient not taking: Reported on 04/22/2015 03/23/15   Robyn Haber, MD  glucose blood test strip Use to test blood sugar twice daily. 03/23/15   Robyn Haber, MD  HYDROcodone-homatropine [facility]) 5-1.5 MG/5ML syrup Take 5 mLs by mouth at bedtime as needed. Patient not taking: Reported on 09/11/2019 04/22/15   Jaynee Eagles, PA-C  Lancets MISC Use to test blood sugar twice daily. 03/23/15   Robyn Haber, MD  metFORMIN (GLUCOPHAGE) 1000 MG tablet Take 1 tablet (1,000 mg total) by mouth 2 (two) times daily with a meal. Patient not taking: Reported on 09/11/2019 03/23/15   Robyn Haber, MD    Allergies    Bee venom  Review of Systems   Review of Systems   Constitutional: Negative for fever.  HENT: Positive for rhinorrhea. Negative for congestion and sore throat.   Respiratory: Positive for cough and shortness of breath.   Cardiovascular: Negative for chest pain.  Neurological: Negative for syncope.  Ten systems reviewed and are negative for acute change, except as noted in the HPI.    Physical Exam Updated Vital Signs BP (!) 170/76   Pulse 97   Resp (!) 23   Ht 5' 5"  (1.651 m)   Wt 113.4 kg   LMP  (LMP Unknown)   SpO2 98%   BMI 41.60 kg/m   Physical Exam Vitals and nursing note reviewed.  Constitutional:      General: She is not in acute distress.    Appearance: She is well-developed. She is not diaphoretic.     Comments: Obese AA female. In NAD.  HENT:     Head: Normocephalic and atraumatic.     Nose: No rhinorrhea.  Eyes:     General: No scleral icterus.    Conjunctiva/sclera: Conjunctivae normal.  Cardiovascular:     Rate and Rhythm: Normal rate and regular rhythm.     Pulses: Normal pulses.  Pulmonary:     Effort: Pulmonary effort is normal. No respiratory distress.     Comments: Sporadic, dry cough x 1 while in exam room. No respiratory distress or hypoxia. Sats 97% or above on RA. Lungs CTAB. Musculoskeletal:        General: Normal range of motion.     Cervical back: Normal range of motion.  Skin:    General: Skin is warm and dry.     Coloration: Skin is not pale.     Findings: No erythema or rash.  Neurological:     Mental Status: She is alert and oriented to person, place, and time.     Coordination: Coordination normal.  Psychiatric:        Behavior: Behavior normal.     ED Results / Procedures / Treatments   Labs (all labs ordered are listed, but only abnormal results are displayed) Labs Reviewed - No data to display  EKG None  Radiology DG Chest [facility] 1 View  Result Date: 09/11/2019 CLINICAL DATA:  Shortness of breath. COVID positive. EXAM: PORTABLE CHEST 1 VIEW COMPARISON:  None. FINDINGS:  Very low lung volumes and soft tissue attenuation from habitus limit assessment. Vague heterogeneous opacities in the lower lung zones. Heart size grossly normal for technique. No pneumothorax or large pleural effusion. IMPRESSION: Hypoventilatory chest. Vague heterogeneous bibasilar opacities most consistent with COVID pneumonia. Electronically Signed   By: Keith Rake M.D.   On: 09/11/2019 01:49    Procedures Procedures (including critical care time)  Medications Ordered in ED Medications  albuterol (VENTOLIN  HFA) 108 (90 Base) MCG/ACT inhaler 2 puff (2 puffs Inhalation Given 09/11/19 0226)  losartan (COZAAR) tablet 100 mg (100 mg Oral Given 09/11/19 0224)    And  hydrochlorothiazide (HYDRODIURIL) tablet 25 mg (25 mg Oral Given 09/11/19 0225)  benzonatate (TESSALON) capsule 100 mg (100 mg Oral Given 09/11/19 0144)  ibuprofen (ADVIL) tablet 800 mg (800 mg Oral Given 09/11/19 0145)  AeroChamber Plus Flo-Vu Large MISC 1 each (1 each Other Given 09/11/19 0226)    ED Course  I have reviewed the triage vital signs and the nursing notes.  Pertinent labs & imaging results that were available during my care of the patient were reviewed by me and considered in my medical decision making (see chart for details).  Clinical Course as of Sep 10 256  Tue Sep 11, 2019  9147 Patient ambulatory in the ED with sats at or above 96% while ambulating.  No complaints of shortness of breath or dyspnea on exertion   [KH]    Clinical Course User Index [KH] Antonietta Breach, PA-C   MDM Rules/Calculators/A&P                      Patient presenting for persistent, harsh cough associated with recent COVID-19 infection.  Was diagnosed on 09/05/2019 at outside facility.  Chest x-ray does show vague findings consistent with Covid pneumonia.  The patient denies any outpatient fevers.  Her oxygen saturations here have been reassuring.  She is able to ambulate in the department without hypoxia.  Given Tessalon and  albuterol for symptom control while in the ED.  Will be discharged with symptomatic treatment.  Encouraged continued follow-up with her primary care doctor.  Return precautions discussed and provided. Patient discharged in stable condition with no unaddressed concerns.  Molly Clements was evaluated in Emergency Department on 09/11/2019 for the symptoms described in the history of present illness. She was evaluated in the context of the global COVID-19 pandemic, which necessitated consideration that the patient might be at risk for infection with the SARS-CoV-2 virus that causes COVID-19. Institutional protocols and algorithms that pertain to the evaluation of patients at risk for COVID-19 are in a state of rapid change based on information released by regulatory bodies including the CDC and federal and state organizations. These policies and algorithms were followed during the patient's care in the ED.  Vitals:   09/11/19 0057 09/11/19 0110 09/11/19 0130  BP: (!) 173/94  (!) 170/76  Pulse: 99  97  Resp: (!) 23  (!) 23  SpO2: 97% 97% 98%  Weight: 113.4 kg    Height: 5' 5"  (1.651 m)      Final Clinical Impression(s) / ED Diagnoses Final diagnoses:  COVID-19 virus infection  Cough  Hypertension not at goal  Noncompliance with medication regimen    Rx / DC Orders ED Discharge Orders         Ordered    benzonatate (TESSALON) 100 MG capsule  3 times daily PRN     09/11/19 0254           Antonietta Breach, PA-C 82/95/62 1308    Delora Fuel, MD 65/78/46 [id number]

## 2019-09-11 NOTE — ED Notes (Signed)
SATURATION QUALIFICATIONS: (This note is used to comply with regulatory documentation for home oxygen)  Patient Saturations on Room Air at Rest = 96%  Patient Saturations on Room Air while Ambulating = 96%   

## 2019-09-11 NOTE — ED Triage Notes (Signed)
Per [facility]: Pt coming from home cough/sob dx with COVID on 2/10. Sats 96% RA, 189/128 with hx htn 110 HR 18 RR. AOx4.

## 2019-09-11 NOTE — Discharge Instructions (Signed)
Use Tessalon as prescribed for cough management.  This can be taken with over-the-counter medications for any persistent symptoms.  Use 2 puffs of an albuterol inhaler every 6 hours for management of cough and shortness of breath.  Resume taking your blood pressure medications as your blood pressure was elevated in the ED today.  We recommend follow-up with your primary care doctor for blood pressure recheck as well as to ensure resolution of symptoms.  Continue to quarantine away from other individuals until your symptoms have resolved x3 days.

## 2019-09-14 DIAGNOSIS — F4321 Adjustment disorder with depressed mood: Secondary | ICD-10-CM | POA: Diagnosis not present

## 2019-09-14 DIAGNOSIS — F331 Major depressive disorder, recurrent, moderate: Secondary | ICD-10-CM | POA: Diagnosis not present

## 2019-10-19 DIAGNOSIS — F3342 Major depressive disorder, recurrent, in full remission: Secondary | ICD-10-CM | POA: Diagnosis not present

## 2019-10-19 DIAGNOSIS — F4322 Adjustment disorder with anxiety: Secondary | ICD-10-CM | POA: Diagnosis not present

## 2019-11-19 DIAGNOSIS — Z1231 Encounter for screening mammogram for malignant neoplasm of breast: Secondary | ICD-10-CM | POA: Diagnosis not present

## 2019-11-19 DIAGNOSIS — Z6841 Body Mass Index (BMI) 40.0 and over, adult: Secondary | ICD-10-CM | POA: Diagnosis not present

## 2019-11-19 DIAGNOSIS — Z1151 Encounter for screening for human papillomavirus (HPV): Secondary | ICD-10-CM | POA: Diagnosis not present

## 2019-11-19 DIAGNOSIS — Z01419 Encounter for gynecological examination (general) (routine) without abnormal findings: Secondary | ICD-10-CM | POA: Diagnosis not present

## 2019-11-28 ENCOUNTER — Telehealth (HOSPITAL_COMMUNITY): Payer: Self-pay | Admitting: Psychiatry

## 2019-11-28 NOTE — Telephone Encounter (Signed)
D:  Pt phoned inquiring about MH-IOP.  A:  Oriented pt.  Encouraged pt to verify her insurance.  Pt will start MH-IOP on 12-03-19 at 9 a.m.  R:  Pt receptive.

## 2019-11-29 ENCOUNTER — Other Ambulatory Visit: Payer: Self-pay

## 2019-11-29 ENCOUNTER — Other Ambulatory Visit (HOSPITAL_COMMUNITY): Payer: BC Managed Care – PPO | Attending: Psychiatry | Admitting: Psychiatry

## 2019-11-29 DIAGNOSIS — Z634 Disappearance and death of family member: Secondary | ICD-10-CM | POA: Insufficient documentation

## 2019-11-29 DIAGNOSIS — E282 Polycystic ovarian syndrome: Secondary | ICD-10-CM | POA: Insufficient documentation

## 2019-11-29 DIAGNOSIS — F329 Major depressive disorder, single episode, unspecified: Secondary | ICD-10-CM | POA: Insufficient documentation

## 2019-11-29 DIAGNOSIS — Z79899 Other long term (current) drug therapy: Secondary | ICD-10-CM | POA: Insufficient documentation

## 2019-11-29 DIAGNOSIS — F4310 Post-traumatic stress disorder, unspecified: Secondary | ICD-10-CM | POA: Insufficient documentation

## 2019-11-29 DIAGNOSIS — Z7984 Long term (current) use of oral hypoglycemic drugs: Secondary | ICD-10-CM | POA: Insufficient documentation

## 2019-11-29 DIAGNOSIS — F419 Anxiety disorder, unspecified: Secondary | ICD-10-CM | POA: Insufficient documentation

## 2019-11-29 DIAGNOSIS — I10 Essential (primary) hypertension: Secondary | ICD-10-CM | POA: Insufficient documentation

## 2019-11-29 NOTE — Progress Notes (Signed)
Virtual Visit via Video Note  I connected with Molly Clements on @TODAY @ at 1300 by a video enabled telemedicine application and verified that I am speaking with the correct person using two identifiers.   I discussed the limitations of evaluation and management by telemedicine and the availability of in person appointments. The patient expressed understanding and agreed to proceed.  I discussed the assessment and treatment plan with the patient. The patient was provided an opportunity to ask questions and all were answered. The patient agreed with the plan and demonstrated an understanding of the instructions.   The patient was advised to call back or seek an in-person evaluation if the symptoms worsen or if the condition fails to improve as anticipated.  I provided 60 minutes of non-face-to-face time during this encounter.    Comprehensive Clinical Assessment (CCA) Note  11/29/2019 Molly Clements 01/29/2020  Visit Diagnosis:   No diagnosis found.    CCA Part One  Part One has been completed on paper by the patient.  (See scanned document in Chart Review)  CCA Part Two A  Intake/Chief Complaint:  CCA Intake With Chief Complaint CCA Part Two Date: 11/29/19 CCA Part Two Time: 1508 Chief Complaint/Presenting Problem: This is a 39 yr old, married, employed, 40 American female who was referred per a coworker/previous pt; treatment for worsening depressive and anxiety symptoms.  Denies SI/HI or A/V hallucinations.  Triggers/Stressors:  1) Job ([employer]) of 17 yrs.  "They are pushing [place] to sale all the time.  They treat you bad and don't respect you."  2)  Feb 2020; pt had COVID-19 among other immediate family members.  3)  Financial Strain d/t tax debt.  Pt states she and husband are trying to purchase a home.  They are currently living with pt's mother, stepfather and niece.  4) Unresolved grief/loss issues:  Nov. 1987 father was murdered; Nov. 2000 brother was murdered.  Pt  denies any prior psychiatric hospitalizations.  Hx of seeing Dr. 07-15-1979; was referred to [facility], LCSW but states she never saw her.  Denies any prior suicide attempts or gestures.  Family hx:  M-GM (Depression); Mother (hx of drugs); Deceased father (depression and hx of drugs) Patients Currently Reported Symptoms/Problems: Sadness, decreased sleep, no appetite (no desire to even drink), tearful, irritable, anxious, poor concentration, isolative, anhedonia, decreased energy, no motivation, ruminating thoughts Collateral Involvement: Reports husband, sisters and one of her brothers as being supportive. Individual's Strengths: "I'm a fun person" Individual's Preferences: "I need to work on asking for help and being late." Type of Services Patient Feels Are Needed: MH-IOP  Mental Health Symptoms Depression:  Depression: Change in energy/activity, Difficulty Concentrating, Irritability, Sleep (too much or little), Tearfulness, Increase/decrease in appetite  Mania:  Mania: N/A  Anxiety:   Anxiety: Worrying  Psychosis:  Psychosis: N/A  Trauma:  Trauma: N/A  Obsessions:  Obsessions: N/A  Compulsions:  Compulsions: N/A  Inattention:  Inattention: N/A  Hyperactivity/Impulsivity:  Hyperactivity/Impulsivity: N/A  Oppositional/Defiant Behaviors:  Oppositional/Defiant Behaviors: N/A  Borderline Personality:  Emotional Irregularity: N/A  Other Mood/Personality Symptoms:      Mental Status Exam Appearance and self-care  Stature:  Stature: Average  Weight:  Weight: Overweight  Clothing:  Clothing: Meticulous  Grooming:  Grooming: Well-groomed  Cosmetic use:  Cosmetic Use: Age appropriate  Posture/gait:  Posture/Gait: Normal  Motor activity:  Motor Activity: Not Remarkable  Sensorium  Attention:  Attention: Normal  Concentration:  Concentration: Normal  Orientation:  Orientation: X5  Recall/memory:  Recall/Memory: Normal  Affect and Mood  Affect:  Affect: Appropriate  Mood:  Mood:  Depressed  Relating  Eye contact:  Eye Contact: Normal  Facial expression:  Facial Expression: Responsive  Attitude toward examiner:  Attitude Toward Examiner: Cooperative  Thought and Language  Speech flow: Speech Flow: Normal  Thought content:  Thought Content: Appropriate to mood and circumstances  Preoccupation:     Hallucinations:     Organization:     Company secretary of Knowledge:  Fund of Knowledge: Average  Intelligence:  Intelligence: Average  Abstraction:  Abstraction: Normal  Judgement:  Judgement: Normal  Reality Testing:  Reality Testing: Adequate  Insight:  Insight: Good  Decision Making:  Decision Making: Normal  Social Functioning  Social Maturity:  Social Maturity: Responsible  Social Judgement:  Social Judgement: Normal  Stress  Stressors:  Stressors: Veterinary surgeon, Arts administrator, Work  Coping Ability:  Coping Ability: Building surveyor Deficits:     Supports:      Family and Psychosocial History: Family history Marital status: Married Number of Years Married: 6 What types of issues is patient dealing with in the relationship?: "supportive" What is your sexual orientation?: heterosexual Does patient have children?: No(wants kids)  Childhood History:  Childhood History By whom was/is the patient raised?: Mother Additional childhood history information: Pt was born in [place].  States addict mother had anger issues.  Pt's addict father was murdered when pt was age 57.  According to pt, he was killed by an off duty Emergency planning/management officer (who was a friend) d/t drugs.  Pt reports she is the oldest of the kids.  "I didn't have a childhood b/c of all my responsibilites.  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  Denies any abuse. Does patient have siblings?: Yes Number of Siblings: 6(one deceased brother) Description of patient's current relationship with siblings: Living 2 sisters, 3 brothers  (pt is the oldest) Did patient suffer  any verbal/emotional/physical/sexual abuse as a child?: No Has patient ever been sexually abused/assaulted/raped as an adolescent or adult?: No Was the patient ever a victim of a crime or a disaster?: No Witnessed domestic violence?: No Has patient been effected by domestic violence as an adult?: No  CCA Part Two B  Employment/Work Situation: Employment / Work Psychologist, occupational Employment situation: Employed Where is patient currently employed?: [employer] How long has patient been employed?: 17 yrs Patient's job has been impacted by current illness: Yes(difficulty functioning) Describe how patient's job has been impacted: Difficulty functioning at work Did [place] Receive Any Psychiatric Treatment/Services While in Equities trader?: No Are There Guns or Other Weapons in Your Home?: No  Education: Education Did Garment/textile technologist From [employer]?: Yes Did Theme park manager?: [employer] college) Did Designer, television/film set?: No 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 Did You Have An Individualized Education Program (IIEP): No Did You Have Any Difficulty At [employer]?: No  Religion: Religion/Spirituality Are You A Religious Person?: Yes What is Your Religious Affiliation?: Chiropodist: Leisure / Recreation Leisure and Hobbies: cooking, scrapbooking, entertaining  Exercise/Diet: Exercise/Diet Do You Exercise?: No Have You Gained or Lost A Significant Amount of Weight in the Past Six Months?: No Do You Follow a Special Diet?: Yes Type of Diet: Diabetic Do You Have Any Trouble Sleeping?: Yes Explanation of Sleeping Difficulties: Getting to sleep and staying asleep  CCA Part Two C  Alcohol/Drug Use: Alcohol / Drug Use Pain Medications: n/a Prescriptions: n/a Over  the Counter: n/a History of alcohol / drug use?: No history of alcohol / drug abuse                      CCA Part Three  ASAM's:  Six Dimensions of Multidimensional Assessment  Dimension 1:  Acute Intoxication  and/or Withdrawal Potential:     Dimension 2:  Biomedical Conditions and Complications:     Dimension 3:  Emotional, Behavioral, or Cognitive Conditions and Complications:     Dimension 4:  Readiness to Change:     Dimension 5:  Relapse, Continued use, or Continued Problem Potential:     Dimension 6:  Recovery/Living Environment:      Substance use Disorder (SUD)    Social Function:  Social Functioning Social Maturity: Responsible Social Judgement: Normal  Stress:  Stress Stressors: Grief/losses, Chiropodist, Work Coping Ability: Overwhelmed Patient Takes Medications The Way The Doctor Instructed?: NA Priority Risk: Moderate Risk  Risk Assessment- Self-Harm Potential: Risk Assessment For Self-Harm Potential Thoughts of Self-Harm: No current thoughts Method: No plan Availability of Means: No access/NA  Risk Assessment -Dangerous to Others Potential: Risk Assessment For Dangerous to Others Potential Method: No Plan Availability of Means: No access or NA Intent: Vague intent or NA Notification Required: No need or identified person  DSM5 Diagnoses: Patient Active Problem List   Diagnosis Date Noted  . Pharyngitis 04/22/2015  . Essential hypertension 04/22/2015  . HTN (hypertension) 03/02/2012  . Migraine 03/02/2012  . PCOS (polycystic ovarian syndrome) 03/02/2012  . Obesity 03/02/2012  . DM (diabetes mellitus) ([place]) 03/02/2012    Patient Centered Plan: Patient is on the following Treatment Plan(s):  Anxiety and Depression  Recommendations for Services/Supports/Treatments: Recommendations for Services/Supports/Treatments Recommendations For Services/Supports/Treatments: IOP (Intensive Outpatient Program)  Treatment Plan Summary:  Oriented pt to virtual MH-IOP.  Pt gave verbal consent for treatment, to release chart information to referred providers and to complete any forms if needed.  Pt also gave consent for attending group virtually d/t COVID-19 social distancing  restrictions.  Encouraged support groups.  F/U with Dr. Chucky May and possibly Nunzio Cobbs, LCSW.    Referrals to Alternative Service(s): Referred to Alternative Service(s):   Place:   Date:   Time:    Referred to Alternative Service(s):   Place:   Date:   Time:    Referred to Alternative Service(s):   Place:   Date:   Time:    Referred to Alternative Service(s):   Place:   Date:   Time:     Dellia Nims, M.Ed,CNA

## 2019-12-03 ENCOUNTER — Other Ambulatory Visit: Payer: Self-pay

## 2019-12-03 ENCOUNTER — Encounter (HOSPITAL_COMMUNITY): Payer: Self-pay | Admitting: Family

## 2019-12-03 ENCOUNTER — Other Ambulatory Visit (HOSPITAL_COMMUNITY): Payer: BC Managed Care – PPO | Admitting: Psychiatry

## 2019-12-03 DIAGNOSIS — F4310 Post-traumatic stress disorder, unspecified: Secondary | ICD-10-CM | POA: Diagnosis not present

## 2019-12-03 DIAGNOSIS — E282 Polycystic ovarian syndrome: Secondary | ICD-10-CM | POA: Diagnosis not present

## 2019-12-03 DIAGNOSIS — F419 Anxiety disorder, unspecified: Secondary | ICD-10-CM | POA: Diagnosis not present

## 2019-12-03 DIAGNOSIS — Z7984 Long term (current) use of oral hypoglycemic drugs: Secondary | ICD-10-CM | POA: Diagnosis not present

## 2019-12-03 DIAGNOSIS — Z79899 Other long term (current) drug therapy: Secondary | ICD-10-CM | POA: Diagnosis not present

## 2019-12-03 DIAGNOSIS — F322 Major depressive disorder, single episode, severe without psychotic features: Secondary | ICD-10-CM

## 2019-12-03 DIAGNOSIS — F329 Major depressive disorder, single episode, unspecified: Secondary | ICD-10-CM | POA: Diagnosis not present

## 2019-12-03 DIAGNOSIS — I10 Essential (primary) hypertension: Secondary | ICD-10-CM | POA: Diagnosis not present

## 2019-12-03 DIAGNOSIS — Z634 Disappearance and death of family member: Secondary | ICD-10-CM | POA: Diagnosis not present

## 2019-12-03 NOTE — Progress Notes (Signed)
Virtual Visit via Telephone Note  I connected with Molly Clements on 12/03/19 at  9:00 AM EDT by telephone and verified that I am speaking with the correct person using two identifiers.   I discussed the limitations, risks, security and privacy concerns of performing an evaluation and management service by telephone and the availability of in person appointments. I also discussed with the patient that there may be a patient responsible charge related to this service. The patient expressed understanding and agreed to proceed.   I discussed the assessment and treatment plan with the patient. The patient was provided an opportunity to ask questions and all were answered. The patient agreed with the plan and demonstrated an understanding of the instructions.   The patient was advised to call back or seek an in-person evaluation if the symptoms worsen or if the condition fails to improve as anticipated.  I provided 15 minutes of non-face-to-face time during this encounter.   Derrill Center, NP    Psychiatric Initial Adult Assessment   Patient Identification: Molly Clements MRN:  [id number] Date of Evaluation:  12/03/2019 Referral Source: Friend Chief Complaint:  Worsening depression and anxiety Visit Diagnosis: No diagnosis found.  History of Present Illness:  Molly Clements is a 39 year old African-American female who presents with worsening depression and anxiety due to reported work-related stressors.  Reports she is currently employed by [employer].  She reports over the past 3 years her current work environment has changed dramatically.  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  States the new demands of her job has caused worsening depression and undue stress. Reported mental health history with anxiety and depression.  Where she reports she is prescribed Xanax and Adderall and mirtazapine?  Patient reports she is followed by Berenda Morale  currently for medication management.  States she is followed by therapist however did not keep her follow-up appointment.  Molly Clements denied previous inpatient admissions.  Denied history of physical or sexual abuse in the past.  Reports occasional use with alcohol.  Denies any other illicit drug use.  Reported family history of mental illness however is unable to identify diagnosis with paternal grandmother or father.  She reports struggling with grief and loss due to the passing of her father and brother.  Patient reports she has been married for the past 6 or 7 years states her husband has been supportive.  Patient to start intensive outpatient programming on 12/03/2019  Associated Signs/Symptoms: Depression Symptoms:  depressed mood, feelings of worthlessness/guilt, difficulty concentrating, (Hypo) Manic Symptoms:  Distractibility, Irritable Mood, Anxiety Symptoms:  Excessive Worry, Psychotic Symptoms:  Hallucinations: None PTSD Symptoms: Had a traumatic exposure:  Reported the murder of her father and brother  Past Psychiatric History:   Previous Psychotropic Medications: No   Substance Abuse History in the last 12 months:  No.  Consequences of Substance Abuse: NA  Past Medical History:  Past Medical History:  Diagnosis Date  . Hypertension   . Migraine   . PCOS (polycystic ovarian syndrome)     Past Surgical History:  Procedure Laterality Date  . ENDOMETRIAL BIOPSY  2008   polypectomy    Family Psychiatric History: Patient does indicate a family history of psychiatric illness however is unable to identify specific diagnoses.  Family History:  Family History  Problem Relation Age of Onset  . Hypertension Mother   . Depression Father   . Drug abuse Father   . Stroke Maternal Grandmother   .  Hypertension Maternal Grandfather   . Cancer Paternal Grandmother     Social History:   Social History   Socioeconomic History  . Marital status: Married    Spouse name:  n/a  . Number of children: 0  . Years of education: 13.5  . Highest education level: Not on file  Occupational History  . Occupation: customer service  Tobacco Use  . Smoking status: Never Smoker  . Smokeless tobacco: Never Used  Substance and Sexual Activity  . Alcohol use: No  . Drug use: No  . Sexual activity: Yes    Partners: Male    Birth control/protection: Pill  Other Topics Concern  . Not on file  Social History Narrative   Lives with fiance Molly Clements   Social Determinants of Health   Financial Resource Strain:   . Difficulty of Paying Living Expenses:   Food Insecurity:   . Worried About Charity fundraiser in the Last Year:   . Arboriculturist in the Last Year:   Transportation Needs:   . Film/video editor (Medical):   Marland Kitchen Lack of Transportation (Non-Medical):   Physical Activity:   . Days of Exercise per Week:   . Minutes of Exercise per Session:   Stress:   . Feeling of Stress :   Social Connections:   . Frequency of Communication with Friends and Family:   . Frequency of Social Gatherings with Friends and Family:   . Attends Religious Services:   . Active Member of Clubs or Organizations:   . Attends Archivist Meetings:   Marland Kitchen Marital Status:     Additional Social History: Reports social drinking  Allergies:   Allergies  Allergen Reactions  . Bee Venom Anaphylaxis    Hives swelling    Metabolic Disorder Labs: Lab Results  Component Value Date   HGBA1C 10.5 03/23/2015   No results found for: PROLACTIN Lab Results  Component Value Date   CHOL 166 03/23/2015   TRIG 125 03/23/2015   HDL 39 (L) 03/23/2015   CHOLHDL 4.3 03/23/2015   VLDL 25 03/23/2015   LDLCALC 102 03/23/2015   Lab Results  Component Value Date   TSH 2.192 03/02/2012    Therapeutic Level Labs: No results found for: LITHIUM No results found for: CBMZ No results found for: VALPROATE  Current Medications: Current Outpatient Medications  Medication Sig  Dispense Refill  . amoxicillin (AMOXIL) 875 MG tablet Take 1 tablet (875 mg total) by mouth 2 (two) times daily. (Patient not taking: Reported on 09/11/2019) 20 tablet 0  . benzonatate (TESSALON) 100 MG capsule Take 1 capsule (100 mg total) by mouth 3 (three) times daily as needed for cough. 21 capsule 0  . Blood Glucose Monitoring Suppl (BLOOD GLUCOSE METER) kit Use to test blood sugar twice daily. 1 each 0  . glipiZIDE (GLUCOTROL) 10 MG tablet Take 1 tablet (10 mg total) by mouth 2 (two) times daily before a meal. (Patient not taking: Reported on 04/22/2015) 60 tablet 0  . glucose blood test strip Use to test blood sugar twice daily. 200 each 3  . HYDROcodone-homatropine (HYCODAN) 5-1.5 MG/5ML syrup Take 5 mLs by mouth at bedtime as needed. (Patient not taking: Reported on 09/11/2019) 120 mL 0  . Lancets MISC Use to test blood sugar twice daily. 200 each 3  . losartan-hydrochlorothiazide (HYZAAR) 100-25 MG tablet Take 1 tablet by mouth daily. 90 tablet 0  . metFORMIN (GLUCOPHAGE) 1000 MG tablet Take 1 tablet (1,000 mg  total) by mouth 2 (two) times daily with a meal. (Patient not taking: Reported on 09/11/2019) 60 tablet 1   No current facility-administered medications for this visit.    Musculoskeletal:  Psychiatric Specialty Exam: Review of Systems  There were no vitals taken for this visit.There is no height or weight on file to calculate BMI.  General Appearance: NA  Eye Contact:  NA  Speech:  Clear and Coherent  Volume:  Normal  Mood:  Anxious and Depressed  Affect:  Congruent  Thought Process:  Coherent  Orientation:  Full (Time, Place, and Person)  Thought Content:  Logical  Suicidal Thoughts:  No  Homicidal Thoughts:  No  Memory:  Immediate;   Fair Recent;   Fair  Judgement:  Fair  Insight:  Fair  Psychomotor Activity:  Normal  Concentration:  Concentration: Fair  Recall:  [employer] of Knowledge:Fair  Language: Good  Akathisia:  No  Handed:  Right  AIMS (if indicated):     Assets:  Communication Skills Resilience Social Support  ADL's:  Intact  Cognition: WNL  Sleep:  Fair   Screenings: PHQ2-9     Office Visit from 04/22/2015 in Primary Care at [place] from 03/23/2015 in [facility] Care at [facility] Total Score  0  0      Assessment and Plan:  Patient to start Intensive outpatient programming (IOP)  Continue medications as directed  Treatment plan was reviewed and agreed upon by NP T. Bobby Rumpf and patient Molly Clements  need for group services   Derrill Center, NP 5/10/202110:37 AM

## 2019-12-03 NOTE — Progress Notes (Signed)
Virtual Visit via Video Note   I connected with Molly Clements, who prefers to go by Molly Clements" on 12/03/19 at 9:00 AM EST by a video enabled telemedicine application and verified that I am speaking with the correct person using two identifiers.   Location: Patient: Patient Home Provider: [facility] Office   Case Manager discussed the limitations of evaluation and management by telemedicine and the availability of in person appointments during orientation. The patient expressed understanding and agreed to proceed.   History of Present Illness: MDD   Observations/Objective: Case Manager checked in with all participants to review discharge dates, insurance authorizations, work-related documents and needs for the treatment team. Counselor facilitated a check-in with group members to gauge mood and current functioning as well as identify recent progress towards treatment goals.  Molly Clements, who prefers to go by Molly Clements" presented to group session on time and was alert, oriented x5, with no evidence or self-report of SI/HI or A/V H.  Molly Clements reported scores of 5/10 for depression and 2/10 for anxiety today.  Molly Clements introduced herself to group today and reported that she began treatment because she has been experiencing increased depression and anxiety due to stressors at work, and needs to develop a new routine and appropriate coping skills.  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  Molly Clements reported that she had a good weekend visiting her mother and they had spaghetti.  Molly Clements reported that her plan this week is to try to establish a new routine , increase focus, and begin thinking about alternative career options that cater to her strengths.  Counselor encouraged her to consider reaching out to community resources to aid in resume building and job search, such as Music therapist.        Counselor covered topic of core beliefs with group today.  Counselor provided handout on the subject, which explained how everyone looks at the world differently, and two people can have the same experience, but have different interpretations of what happened.  Members were encouraged to think of these like sunglasses with different "shades" influencing perception towards positive or negative outcomes.  Examples of negative core beliefs were provided, such as "I'm unlovable", "I'm not good enough", and "I'm a bad person".  Members were asked to identify which one(s) they could relate to, and consider pieces of evidence which contradict this belief.  Counselor also provided psychoeducation on positive affirmations today.  Counselor explained how these are positive statements which can be spoken out loud or recited mentally to challenge negative thoughts and/or core beliefs to improve mood and outlook each day.  Counselor provided a comprehensive list of affirmations to members with different categories, including ones for health, confidence, success, and happiness.  Counselor invited members to look through this list and identify any which resonated with them, and practice saying them out loud with sincerity.  Intervention was effective, as evidenced by Molly Clements engaging in discussion on the subject and reporting that one of her biggest issues is that she can be too organized, to the point that it can resemble traits of obsessive compulsive disorder.  Molly Clements stated "I don't want people to think I'm too perfect and it makes me feel like something is wrong with me when people point my perfectionism out".  Molly Clements reported that she can also feel impatient at times, and another core belief tied into  this is "Why am I not further along in life yet?", as she finds herself comparing herself to other people and their overalls accomplishments in contrast to her own.  She was receptive to positive affirmations that were offered,  and reported that she found a few helpful to begin practicing, such as "I get better each day in each and every way", as well as "I am who I want to be, starting right now".  Molly Clements reported that she intends to try telling herself more supportive statements each day to improve resiliency moving forward with treatment.    Assessment and Plan: Counselor recommends that patient remains in IOP treatment to better manage mental health symptoms and continue to address treatment plan goals. Counselor recommends adherence to crisis/safety plan, taking medications as prescribed and following up with medical professionals if any issues arise.    Follow Up Instructions: Counselor will send Webex link for next session.  The patient was advised to call back or seek an in-person evaluation if the symptoms worsen or if the condition fails to improve as anticipated.   I provided 180 minutes of non-face-to-face time during this encounter.     Shade Flood, LCSW, LCAS

## 2019-12-04 ENCOUNTER — Other Ambulatory Visit: Payer: Self-pay

## 2019-12-04 ENCOUNTER — Other Ambulatory Visit (HOSPITAL_COMMUNITY): Payer: BC Managed Care – PPO | Admitting: Licensed Clinical Social Worker

## 2019-12-04 DIAGNOSIS — Z634 Disappearance and death of family member: Secondary | ICD-10-CM | POA: Diagnosis not present

## 2019-12-04 DIAGNOSIS — Z79899 Other long term (current) drug therapy: Secondary | ICD-10-CM | POA: Diagnosis not present

## 2019-12-04 DIAGNOSIS — F322 Major depressive disorder, single episode, severe without psychotic features: Secondary | ICD-10-CM

## 2019-12-04 DIAGNOSIS — E282 Polycystic ovarian syndrome: Secondary | ICD-10-CM | POA: Diagnosis not present

## 2019-12-04 DIAGNOSIS — Z7984 Long term (current) use of oral hypoglycemic drugs: Secondary | ICD-10-CM | POA: Diagnosis not present

## 2019-12-04 DIAGNOSIS — I10 Essential (primary) hypertension: Secondary | ICD-10-CM | POA: Diagnosis not present

## 2019-12-04 DIAGNOSIS — F411 Generalized anxiety disorder: Secondary | ICD-10-CM

## 2019-12-04 DIAGNOSIS — F419 Anxiety disorder, unspecified: Secondary | ICD-10-CM | POA: Diagnosis not present

## 2019-12-04 DIAGNOSIS — F329 Major depressive disorder, single episode, unspecified: Secondary | ICD-10-CM | POA: Diagnosis not present

## 2019-12-04 DIAGNOSIS — F4310 Post-traumatic stress disorder, unspecified: Secondary | ICD-10-CM | POA: Diagnosis not present

## 2019-12-04 NOTE — Progress Notes (Signed)
Virtual Visit via Video Note  I connected with Molly Clements on 12/04/19 at  9:00 AM EDT by a video enabled telemedicine application and verified that I am speaking with the correct person using two identifiers.   Case Manager discussed the limitations of evaluation and management by telemedicine and the availability of in person appointments. The patient expressed understanding and agreed to proceed.  Location:  Patient: Patient Home Provider: [place] Office  History of Present Illness: MDD, GAD  Observations/Objective: Case Manager checked in with all participants to review discharge dates, insurance authorizations, work-related documents and needs for the treatment team. Clinician facilitated a check-in with group members to assess mood and current functioning. Clinician introduced self and prompted client to provide an update on functioning since last group session. Clinician provided calming meditation focused on Gratitude and encouraged members to notice wandering thoughts and bring them back to the present moment. Client checked in and introduced self to clinician and reported that she has experienced increasing stress related to her current employment. Client reported that she has been employed with this company for 17 years and is experiencing burn out and is undecided on whether she will return following completion of this program. Client spoke to a peer regarding their shared experiences with this employer and acknowledged her personal strengths and motivation to find new employment which will decrease stress. Client denied any current SI/HI/psychosis.   Psycho-educational portion of group centered on self-care along with the impact it has on mental health. Clinician shared psychoeducational video 'Self Care: What It Really Is' which challenges traditional thoughts on what self-care looks like. Clinician allowed space for member's to process their responses to the video and  encouraged them to share with peers.  Clinician utilized open ended questions to prompt discussion when needed and appropriate re-direction. Clinician praised client's insight and willingness to engage. Client engaged in discussion on her current engagement in self-care and relayed that she often neglects herself to take care of others. Client reported that she is the oldest of 5 children and has always played a motherly role for others.   Clinician shared resource handout, 'Self Care Assessment' which included examples of multiple dimensions of self-care including- Physical, Emotional, Social, Spiritual, and Professional. Clinician encouraged members to complete provided assessment and identify areas in which they are doing well as well as areas that can be improved. Clinician and members engaged in discussion on their responses including barriers to their completion of self-care and motivation to make positive change in their self-care practice. Clinician facilitated check out assessing an area of gratitude and self-care activity that members will engage in prior to next group session. Client completed provided assessment and reported that she is struggling to engage in social self care and this has been greatly impacted by the pandemic. Client reported that she enjoys hosting others and "laughing, hugging, spending time together" and that this has been difficult due to fear of being exposed to the coronavirus. Client engaged in check out and reported feeling grateful for "family and my health" and plans to take care of herself by drinking more water.   Assessment and Plan: Clinician recommends that patient remains in IOP treatment to better manage mental health symptoms and continue to address treatment plan goals. Clinician recommends adherence to crisis/safety plan, taking medications as prescribed, and following up with medical professionals if any issues arise.  Follow Up Instructions: Clinician will  send Webex link for next session. The patient was advised to call  back or seek an in-person evaluation if the symptoms worsen or if the condition fails to improve as anticipated.    I provided 180 minutes of non-face-to-face time during this encounter.   Francine Graven, LCSW

## 2019-12-05 ENCOUNTER — Other Ambulatory Visit: Payer: Self-pay

## 2019-12-05 ENCOUNTER — Other Ambulatory Visit (HOSPITAL_COMMUNITY): Payer: BC Managed Care – PPO | Admitting: Licensed Clinical Social Worker

## 2019-12-05 DIAGNOSIS — I10 Essential (primary) hypertension: Secondary | ICD-10-CM | POA: Diagnosis not present

## 2019-12-05 DIAGNOSIS — E282 Polycystic ovarian syndrome: Secondary | ICD-10-CM | POA: Diagnosis not present

## 2019-12-05 DIAGNOSIS — F329 Major depressive disorder, single episode, unspecified: Secondary | ICD-10-CM | POA: Diagnosis not present

## 2019-12-05 DIAGNOSIS — F411 Generalized anxiety disorder: Secondary | ICD-10-CM

## 2019-12-05 DIAGNOSIS — Z79899 Other long term (current) drug therapy: Secondary | ICD-10-CM | POA: Diagnosis not present

## 2019-12-05 DIAGNOSIS — Z7984 Long term (current) use of oral hypoglycemic drugs: Secondary | ICD-10-CM | POA: Diagnosis not present

## 2019-12-05 DIAGNOSIS — F4310 Post-traumatic stress disorder, unspecified: Secondary | ICD-10-CM | POA: Diagnosis not present

## 2019-12-05 DIAGNOSIS — F419 Anxiety disorder, unspecified: Secondary | ICD-10-CM | POA: Diagnosis not present

## 2019-12-05 DIAGNOSIS — Z634 Disappearance and death of family member: Secondary | ICD-10-CM | POA: Diagnosis not present

## 2019-12-05 DIAGNOSIS — F322 Major depressive disorder, single episode, severe without psychotic features: Secondary | ICD-10-CM

## 2019-12-05 NOTE — Progress Notes (Signed)
Virtual Visit via Video Note   I connected with Molly Clements, who prefers to go by Molly Clements" on 12/05/19 at 9:00 AM EST by a video enabled telemedicine application and verified that I am speaking with the correct person using two identifiers.   Location: Patient: Patient Home Provider: [facility]   Case Manager discussed the limitations of evaluation and management by telemedicine and the availability of in person appointments during orientation. The patient expressed understanding and agreed to proceed.   History of Present Illness: MDD and GAD   Observations/Objective: Case Manager checked in with all participants to review discharge dates, insurance authorizations, work-related documents and needs for the treatment team. Counselor facilitated a check-in with group members to gauge mood and current functioning as well as identify recent progress towards treatment goals.  Molly Clements, who prefers to go by Molly Clements" presented to group session on time and was alert, oriented x5, with no evidence or self-report of SI/HI or A/V H.  Molly Clements reported scores of 4/10 for depression and 2/10 for anxiety today.  Molly Clements reported that yesterday she took her brother to an appointment, watched a movie, and also attended a [employer] on 'financial bootcamp', which covered topics such as having a trust versus a will in place.  Molly Clements reported that this was interesting and she intends to continue attending this in free time to learn more and keep busy.  Molly Clements reported that her plan today is to organize some of her seasonal clothing items.        Counselor introduced topic of building social support network today.  Counselor explained how this can be defined as having a having a group of healthy people in one's life you can talk to, spend time with, and get help from to improve both mental and physical health.  Counselor noted that some barriers can make it difficult to connect with other people, including the presence of  anxiety or depression, or moving to an unfamiliar area.  Group members were asked to assess the current state of their support network, and identify ways that this could be improved.  Tips were given on how to address previously noted barriers, such as strengthening social skills, using relaxation techniques to reduce anxiety, scheduling social time each week, and/or exploring social events nearby which could increase chances of meeting new supports.  Members were also encouraged to consider getting closer to people they already know through suggestions such as outreaching someone by text, email or phone call if they haven't spoken in awhile, doing something nice for a friend/family member unexpectedly, and/or inviting someone over for a game/movie/dinner night.  Intervention was effective, as evidenced by Molly Clements engaging in discussion on this topic and reporting that she finds it difficult to open up to other people at times because many tend to minimize her problems when she does speak up about problems she is facing in life.  Molly Clements stated "That causes me to sugarcoat stuff, but I've tried being a bit more open recently".  Molly Clements reported that she believes she has an approachable personality overall and regularly offers her support to other people, but hopes to find this reciprocated more often from people in her direct network, such as her mother, husband, and close friends.     Assessment and Plan: Counselor recommends that patient remains in IOP treatment to better manage mental health symptoms and continue to address treatment plan goals. Counselor recommends adherence to crisis/safety plan, taking medications as prescribed and following up with medical  professionals if any issues arise.    Follow Up Instructions: Counselor will send Webex link for next session.  The patient was advised to call back or seek an in-person evaluation if the symptoms worsen or if the condition fails to improve as anticipated.    I provided 180 minutes of non-face-to-face time during this encounter.     Shade Flood, LCSW, LCAS

## 2019-12-06 ENCOUNTER — Other Ambulatory Visit: Payer: Self-pay

## 2019-12-06 ENCOUNTER — Other Ambulatory Visit (HOSPITAL_COMMUNITY): Payer: BC Managed Care – PPO | Admitting: Licensed Clinical Social Worker

## 2019-12-06 DIAGNOSIS — I10 Essential (primary) hypertension: Secondary | ICD-10-CM | POA: Diagnosis not present

## 2019-12-06 DIAGNOSIS — F419 Anxiety disorder, unspecified: Secondary | ICD-10-CM | POA: Diagnosis not present

## 2019-12-06 DIAGNOSIS — F329 Major depressive disorder, single episode, unspecified: Secondary | ICD-10-CM | POA: Diagnosis not present

## 2019-12-06 DIAGNOSIS — F322 Major depressive disorder, single episode, severe without psychotic features: Secondary | ICD-10-CM

## 2019-12-06 DIAGNOSIS — F411 Generalized anxiety disorder: Secondary | ICD-10-CM

## 2019-12-06 DIAGNOSIS — Z79899 Other long term (current) drug therapy: Secondary | ICD-10-CM | POA: Diagnosis not present

## 2019-12-06 DIAGNOSIS — Z7984 Long term (current) use of oral hypoglycemic drugs: Secondary | ICD-10-CM | POA: Diagnosis not present

## 2019-12-06 DIAGNOSIS — F4310 Post-traumatic stress disorder, unspecified: Secondary | ICD-10-CM | POA: Diagnosis not present

## 2019-12-06 DIAGNOSIS — E282 Polycystic ovarian syndrome: Secondary | ICD-10-CM | POA: Diagnosis not present

## 2019-12-06 DIAGNOSIS — Z634 Disappearance and death of family member: Secondary | ICD-10-CM | POA: Diagnosis not present

## 2019-12-06 NOTE — Progress Notes (Signed)
Virtual Visit via Video Note  I connected with Lakeisa L Kanitz on 12/06/19 at  9:00 AM EDT by a video enabled telemedicine application and verified that I am speaking with the correct person using two identifiers.  The patient was advised to call back or seek an in-person evaluation if the symptoms worsen or if the condition fails to improve as anticipated.  Location:  Patient: Patient Home Provider: [facility]  History of Present Illness: MDD, GAD  Observations/Objective: Case Manager checked in with all participants to review discharge dates, insurance authorizations, work-related documents and needs for the treatment team. Clinician facilitated a check-in with group members to assess mood and current functioning. Clinician introduced self and prompted client to provide an update on functioning since last group session. Client checked in by providing an update and reported "things are going well". Client relayed that she has stayed busy by planning a birthday party for her father which will take place in June. Client discussed her love for her family, specifically her parents. Client denied any current SI/HI/psychosis. Clinician finished check in providing grounding exercise/guided meditation on physical and emotional healing and encouraged members to practice mindfulness in noticing their mind wondering and bringing it back to the present moment.  Group discussion was provided by [facility] with [facility]. Alex introduced self her role with joining the IOP group to provide psycho-education on specific services and programs offered within [employer]. Alex allowed space for members to ask questions and inquire on specific programs that they are interested in. Clinician followed up with members following Alex's presentation and facilitated discussion on which programs they plan to look into or inquire on. Clinician assessed for 1 self-care activity prior to tomorrow's  group. Client did not identify a specific program that she plans to look into following discharge from this program however relayed that she has a psychiatrist in place that she will continue working with. Client reported that she plans to "do something physical, go for a walk, and socialize" in order to take care of herself today.  Assessment and Plan: Clinician recommends that patient remains in IOP treatment to better manage mental health symptoms and continue to address treatment plan goals. Clinician recommends adherence to crisis/safety plan, taking medications as prescribed, and following up with medical professionals if any issues arise.  Follow Up Instructions: Clinician will send Webex link for next session. The patient was advised to call back or seek an in-person evaluation if the symptoms worsen or if the condition fails to improve as anticipated.   I provided 180 minutes of non-face-to-face time during this encounter.   Francine Graven, LCSW

## 2019-12-07 ENCOUNTER — Other Ambulatory Visit: Payer: Self-pay

## 2019-12-07 ENCOUNTER — Other Ambulatory Visit (HOSPITAL_COMMUNITY): Payer: BC Managed Care – PPO | Admitting: Psychiatry

## 2019-12-07 DIAGNOSIS — Z7984 Long term (current) use of oral hypoglycemic drugs: Secondary | ICD-10-CM | POA: Diagnosis not present

## 2019-12-07 DIAGNOSIS — Z634 Disappearance and death of family member: Secondary | ICD-10-CM | POA: Diagnosis not present

## 2019-12-07 DIAGNOSIS — F322 Major depressive disorder, single episode, severe without psychotic features: Secondary | ICD-10-CM

## 2019-12-07 DIAGNOSIS — F411 Generalized anxiety disorder: Secondary | ICD-10-CM

## 2019-12-07 DIAGNOSIS — F419 Anxiety disorder, unspecified: Secondary | ICD-10-CM | POA: Diagnosis not present

## 2019-12-07 DIAGNOSIS — I10 Essential (primary) hypertension: Secondary | ICD-10-CM | POA: Diagnosis not present

## 2019-12-07 DIAGNOSIS — E282 Polycystic ovarian syndrome: Secondary | ICD-10-CM | POA: Diagnosis not present

## 2019-12-07 DIAGNOSIS — F4310 Post-traumatic stress disorder, unspecified: Secondary | ICD-10-CM | POA: Diagnosis not present

## 2019-12-07 DIAGNOSIS — Z79899 Other long term (current) drug therapy: Secondary | ICD-10-CM | POA: Diagnosis not present

## 2019-12-07 DIAGNOSIS — F329 Major depressive disorder, single episode, unspecified: Secondary | ICD-10-CM | POA: Diagnosis not present

## 2019-12-07 NOTE — Progress Notes (Signed)
Virtual Visit via Video Note   I connected with Molly Clements, who prefers to go by [employer]" on 12/07/19 at 9:00 AM EST by a video enabled telemedicine application and verified that I am speaking with the correct person using two identifiers.   Location: Patient: Patient Home Provider: OPT [place] Office   Case Manager discussed the limitations of evaluation and management by telemedicine and the availability of in person appointments during orientation. The patient expressed understanding and agreed to proceed.   History of Present Illness: MDD and GAD   Observations/Objective: Case Manager checked in with all participants to review discharge dates, insurance authorizations, work-related documents and needs for the treatment team. Counselor facilitated a check-in with group members to gauge mood and current functioning as well as identify recent progress towards treatment goals.  Akyia, who prefers to go by [employer]" presented to group session on time and was alert, oriented x5, with no evidence or self-report of SI/HI or A/V H.  [name] reported scores of 3/10 for depression and 4/10 for anxiety today.  [name] reported that she was doing well today and spent the day before cleaning the kitchen, and having dinner with her family later on.  [name] reported that she needs to learn to take breaks for rest and ask for help more willingly, as she listened to another member discussing how they push themselves even when feeling sick.  [name] reported that today she would take time to go shopping for leisure, and potentially prep some keto meals for diet.        Counselor introduced topic of creating mental health maintenance plan today.  Counselor provided handout on subject to members, which stressed the importance of maintaining one's mental health in a similar way to using diet and exercise to ensure physical health.  Counselor walked members through process of identifying triggers which could worsen symptoms,  including specific people, places, and things one needs to avoid.  Members were also tasked with identifying warning signs such as thoughts, feelings, or behaviors which could indicate mental health is at increased risk.  Counselor also facilitated conversation on self-care activities and coping strategies which members have previously utilized in the past, are currently using in daily routine, or plan to use soon to assist with managing problems or symptoms when/if they appear.  Counselor encouraged members to revisit their maintenance plan often and make changes as needed to ensure day to day stability.  Intervention was effective, as evidenced by [name] participating in activity and reporting that some of her triggers include someone coming to her and stating "We need to talk" without context, people coming off as aggressive or bossing her around, and stated "Words can really rile me up".  [name] reported that this can be embarrassing at times for her, and acknowledged that she needs to work on improving anger management skills during treatment to aid in effective mood regulation.  [name] reported that some of her warning signs include increased isolation, obsessing over her weight, and becoming more critical of herself.  [name] reported that her self-care routine includes eating healthier and hydrating often, making lists to stay accountable, and taking walks more often.  Additionally, [name] reported that her IOP schedule has improved her coping ability, as she now has a reason to get up early so she is less prone to rushing, which tends to make her anxiety increase.     Assessment and Plan: Counselor recommends that patient remains in IOP treatment to better manage mental health symptoms  and continue to address treatment plan goals. Counselor recommends adherence to crisis/safety plan, taking medications as prescribed and following up with medical professionals if any issues arise.    Follow Up  Instructions: Counselor will send Webex link for next session.  The patient was advised to call back or seek an in-person evaluation if the symptoms worsen or if the condition fails to improve as anticipated.   I provided 180 minutes of non-face-to-face time during this encounter.     Noralee Stain, LCSW, LCAS

## 2019-12-10 ENCOUNTER — Other Ambulatory Visit (HOSPITAL_COMMUNITY): Payer: BC Managed Care – PPO | Admitting: Licensed Clinical Social Worker

## 2019-12-10 ENCOUNTER — Other Ambulatory Visit: Payer: Self-pay

## 2019-12-10 DIAGNOSIS — F411 Generalized anxiety disorder: Secondary | ICD-10-CM

## 2019-12-10 DIAGNOSIS — Z79899 Other long term (current) drug therapy: Secondary | ICD-10-CM | POA: Diagnosis not present

## 2019-12-10 DIAGNOSIS — Z7984 Long term (current) use of oral hypoglycemic drugs: Secondary | ICD-10-CM | POA: Diagnosis not present

## 2019-12-10 DIAGNOSIS — E282 Polycystic ovarian syndrome: Secondary | ICD-10-CM | POA: Diagnosis not present

## 2019-12-10 DIAGNOSIS — I10 Essential (primary) hypertension: Secondary | ICD-10-CM | POA: Diagnosis not present

## 2019-12-10 DIAGNOSIS — Z634 Disappearance and death of family member: Secondary | ICD-10-CM | POA: Diagnosis not present

## 2019-12-10 DIAGNOSIS — F322 Major depressive disorder, single episode, severe without psychotic features: Secondary | ICD-10-CM

## 2019-12-10 DIAGNOSIS — F329 Major depressive disorder, single episode, unspecified: Secondary | ICD-10-CM | POA: Diagnosis not present

## 2019-12-10 DIAGNOSIS — F419 Anxiety disorder, unspecified: Secondary | ICD-10-CM | POA: Diagnosis not present

## 2019-12-10 DIAGNOSIS — F4310 Post-traumatic stress disorder, unspecified: Secondary | ICD-10-CM | POA: Diagnosis not present

## 2019-12-10 NOTE — Progress Notes (Signed)
Virtual Visit via Video Note   I connected with Molly Clements, who prefers to go by [employer]" on 12/10/19 at 9:45 AM EST by a video enabled telemedicine application and verified that I am speaking with the correct person using two identifiers.   Location: Patient: Patient Home Provider: [facility] Office   Case Manager discussed the limitations of evaluation and management by telemedicine and the availability of in person appointments during orientation. The patient expressed understanding and agreed to proceed.   History of Present Illness: MDD   Observations/Objective: Case Manager checked in with all participants to review discharge dates, insurance authorizations, work-related documents and needs for the treatment team. Counselor facilitated a check-in with group members to gauge mood and current functioning as well as identify recent progress towards treatment goals.  Molly Clements, who prefers to go by [employer]" presented to group 45 minutes later and apologized for this, reporting that she woke up with a headache and some neck pain which slowed her down.  She was alert, oriented x5, with no evidence or self-report of SI/HI or A/V H.  Molly Clements reported scores of 0/10 for depression and 4/10 for anxiety today.  Molly Clements reported that she suspects she didn't feel well because she ate some [place] ham the night before, which was very salty, leading to dehydration overnight.  She reported that she had a good weekend overall, and spent it with her boyfriend having a date night Friday to eat [place] food, as well as going shopping and watching movies.  She reported that she does not have concrete plans for today or the week ahead besides visiting her boyfriend's family next weekend in [place].         Counselor introduced topic of anger management today.  Counselor shared a handout with members on this subject featuring a variety of coping skills, and facilitated discussion on these approaches.  Examples  included raising awareness of anger triggers, practicing deep breathing, keeping an anger log to better understand episodes, using diversion activities to distract oneself for 30 minutes, taking a time out when necessary, and being mindful of warning signs tied to thoughts or behavior.  Counselor inquired about which techniques group members have used before, what has proved to be helpful, what their unique warning signs might be, as well as what they will try out in the future to assist with de-escalation.  Intervention was effective, as evidenced by Molly Clements engaging in discussion on the subject and noting that the majority of her anger tends to arise when dealing with people that don't see eye to eye with her.  Molly Clements reported that this is most prominent when people are bossy and try to tell her what she should be doing.  She stated "It sets me off when people act intrusive and get into my business like that".  She reported that she is trying to improve anger management by exploring distraction techniques she can use in the moment to intervene, as well as communication skills that can increase assertiveness without coming off as aggressive, as she acknowledged that she can grow upset quickly and this has embarrassed her afterward.    Assessment and Plan: Counselor recommends that patient remains in IOP treatment to better manage mental health symptoms and continue to address treatment plan goals. Counselor recommends adherence to crisis/safety plan, taking medications as prescribed and following up with medical professionals if any issues arise.    Follow Up Instructions: Counselor will send Webex link for next session.  The patient was  advised to call back or seek an in-person evaluation if the symptoms worsen or if the condition fails to improve as anticipated.   I provided 135 minutes of non-face-to-face time during this encounter.     Noralee Stain, LCSW, LCAS

## 2019-12-11 ENCOUNTER — Other Ambulatory Visit (HOSPITAL_COMMUNITY): Payer: BC Managed Care – PPO | Admitting: Licensed Clinical Social Worker

## 2019-12-11 ENCOUNTER — Other Ambulatory Visit: Payer: Self-pay

## 2019-12-11 DIAGNOSIS — Z79899 Other long term (current) drug therapy: Secondary | ICD-10-CM | POA: Diagnosis not present

## 2019-12-11 DIAGNOSIS — I10 Essential (primary) hypertension: Secondary | ICD-10-CM | POA: Diagnosis not present

## 2019-12-11 DIAGNOSIS — F329 Major depressive disorder, single episode, unspecified: Secondary | ICD-10-CM | POA: Diagnosis not present

## 2019-12-11 DIAGNOSIS — Z634 Disappearance and death of family member: Secondary | ICD-10-CM | POA: Diagnosis not present

## 2019-12-11 DIAGNOSIS — F322 Major depressive disorder, single episode, severe without psychotic features: Secondary | ICD-10-CM

## 2019-12-11 DIAGNOSIS — F4310 Post-traumatic stress disorder, unspecified: Secondary | ICD-10-CM | POA: Diagnosis not present

## 2019-12-11 DIAGNOSIS — Z7984 Long term (current) use of oral hypoglycemic drugs: Secondary | ICD-10-CM | POA: Diagnosis not present

## 2019-12-11 DIAGNOSIS — F419 Anxiety disorder, unspecified: Secondary | ICD-10-CM | POA: Diagnosis not present

## 2019-12-11 DIAGNOSIS — E282 Polycystic ovarian syndrome: Secondary | ICD-10-CM | POA: Diagnosis not present

## 2019-12-12 ENCOUNTER — Other Ambulatory Visit (HOSPITAL_COMMUNITY): Payer: BC Managed Care – PPO | Admitting: Licensed Clinical Social Worker

## 2019-12-12 ENCOUNTER — Other Ambulatory Visit: Payer: Self-pay

## 2019-12-12 DIAGNOSIS — F322 Major depressive disorder, single episode, severe without psychotic features: Secondary | ICD-10-CM

## 2019-12-12 DIAGNOSIS — F329 Major depressive disorder, single episode, unspecified: Secondary | ICD-10-CM | POA: Diagnosis not present

## 2019-12-12 DIAGNOSIS — Z79899 Other long term (current) drug therapy: Secondary | ICD-10-CM | POA: Diagnosis not present

## 2019-12-12 DIAGNOSIS — F419 Anxiety disorder, unspecified: Secondary | ICD-10-CM | POA: Diagnosis not present

## 2019-12-12 DIAGNOSIS — Z7984 Long term (current) use of oral hypoglycemic drugs: Secondary | ICD-10-CM | POA: Diagnosis not present

## 2019-12-12 DIAGNOSIS — Z634 Disappearance and death of family member: Secondary | ICD-10-CM | POA: Diagnosis not present

## 2019-12-12 DIAGNOSIS — I10 Essential (primary) hypertension: Secondary | ICD-10-CM | POA: Diagnosis not present

## 2019-12-12 DIAGNOSIS — F4310 Post-traumatic stress disorder, unspecified: Secondary | ICD-10-CM | POA: Diagnosis not present

## 2019-12-12 DIAGNOSIS — E282 Polycystic ovarian syndrome: Secondary | ICD-10-CM | POA: Diagnosis not present

## 2019-12-12 DIAGNOSIS — F411 Generalized anxiety disorder: Secondary | ICD-10-CM

## 2019-12-12 NOTE — Progress Notes (Signed)
Virtual Visit via Video Note  I connected with Molly Clements on 12/11/2019 at  9:00 AM EDT by a video enabled telemedicine application and verified that I am speaking with the correct person using two identifiers.   Case Manager discussed the limitations of evaluation and management by telemedicine and the availability of in person appointments. The patient expressed understanding and agreed to proceed.  Location:  Patient: Patient Home Provider: [facility]  History of Present Illness: MDD  Observations/Objective: Case Manager checked in with all participants to review discharge dates, insurance authorizations, work-related documents and needs for the treatment team. Clinician facilitated a check-in with group members to assess mood and current functioning. Clinician introduced self and prompted client to provide an update on functioning since last group session. Client checked in and reported feelings less depressed however has continued to feel anxious. 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 Client engaged in discussion on her plans and hopes to be able to make a decent income in working for herself. Client did not endorse SI/HI/psychosis during this engagement.  Psycho-educational portion of group was centered on the impact language has on thoughts, feelings, behaviors. Clinician introduced video titled 'Happy Brain: How to Overcome Our Neural Predispositions to Suffering'. Clinician prompted members to journal on their responses to the video and then share with the group. Client reported that she enjoyed the video specifically in which the speaker walked through an activity in which client was able to imagine 5 loved ones and send them love and gratitude. Client engaged in discussion with peers on her tendency to identify negatives in a situation rather than looking for positive. Client shared that  various experiences in her life, specifically those related to grief and loss, have caused her to struggle in seeing positivity in others and situations.  Clinician shared resource handout titled 'Pain to Power Vocabulary" and joined members in reviewing examples in which negative language can be reframed to empower the individual. Clinician praised client's participation and inquired as to any examples on the handout resonated with client. Clinician then proceeded with checkout and assessed for 1 self-care activity prior to tomorrow's group. Client provided feedback and encouragement to a fellow member in relating to his shared experience in lashing out at others in anger and the remorse that she typically feels afterward. Client processed with this peer means in which they can challenge this painful feeling and self-talk related to the event into an empowering and more positive way. Client reported that she plans on taking care of herself this afternoon by establishing a schedule for structure in her days while at home.  Assessment and Plan: Clinician recommends that patient remains in IOP treatment to better manage mental health symptoms and continue to address treatment plan goals. Clinician recommends adherence to crisis/safety plan, taking medications as prescribed, and following up with medical professionals if any issues arise.  Follow Up Instructions: Clinician will send Webex link for next session. The patient was advised to call back or seek an in-person evaluation if the symptoms worsen or if the condition fails to improve as anticipated.   I provided 180 minutes of non-face-to-face time during this encounter.   Francine Graven, LCSW

## 2019-12-12 NOTE — Progress Notes (Signed)
Virtual Visit via Video Note   I connected with Vielka Grothaus, who prefers to go by [name]" on 12/12/19 at 9:00 AM EST by a video enabled telemedicine application and verified that I am speaking with the correct person using two identifiers.   Location: Patient: Patient Home Provider: [facility]   Case Manager discussed the limitations of evaluation and management by telemedicine and the availability of in person appointments during orientation. The patient expressed understanding and agreed to proceed.   History of Present Illness: MDD and GAD   Observations/Objective: Case Manager checked in with all participants to review discharge dates, insurance authorizations, work-related documents and needs for the treatment team. Counselor facilitated a check-in with group members to gauge mood and current functioning as well as identify recent progress towards treatment goals.  Evalin, who prefers to go by [name]" presented to group session on time and was alert, oriented x5, with no evidence or self-report of SI/HI or A/V H.  [name] reported scores of 0/10 for depression and 5/10 for anxiety today.  [name] reported that yesterday was 'low key' for her, as she went to the eye doctor for an exam, and didn't do much else besides relax.  [name] reported that she was frustrated at the outcome of this exam, as she might need bifocals eventually.  [name] reported that today she may go get groceries and have her nails done, stating "I'm going to take it easy".  [name] reported that she still needs to begin her keto diet soon to improve health, and noted that she isn't a good self-starter.       Psycho-educational portion of group was provided by pharmacist, Peggye Fothergill. Pharmacist provided psychoeducation on classes of medications such as antidepressants, antipsychotics, what symptoms they are intended to treat, and any side effects one might encounter while on prescription.  Time was allowed for clients to ask  any questions they might have of pharmacist.  [name] did not participate in this portion of group.     Counselor introduced topic of grounding skills today.  Counselor defined these as simple strategies one can use to help detach from difficult thoughts or feelings temporarily by focusing on something else.  Counselor noted that grounding will not solve the problem at hand, but can provide the practitioner with time to regain control over their thoughts and/or feelings and prevent the situation from getting worse (i.e. interrupting a panic attack).  Counselor divided these into three categories (mental, physical, and soothing) and then provided examples of each which group members could practice during session.  Some of these included describing one's environment in detail or playing a categories game with oneself for mental category, taking a hot bath/shower, stretching, or carrying a grounding object for physical category, and saying kind statements, or visualizing people one cares about for soothing category.  Counselor inquired about which techniques members have used with success in the past, or will commit to learning, practicing, and applying now to improve coping abilities.    Interventions were effective, as evidenced by [name] participating in discussion on the subject and trying out some of the grounding exercises that were covered.  [name] reported that she found the mental grounding technique of positive visualization to be appealing, as she sometimes finds herself thinking about close family or friends when stressed out, and this tends to calm her down slightly.  [name] also reported that guided imagery can be helpful for taking her mind off things, and likes the idea of imagining  distant getaways to places like [place] where her problems would not follow her.  [name] also reported that she practices physical grounding often by using a stress ball at work to relieve tension her body may be retaining related to  stress.    Counselor ended session by acknowledging a graduating group member by prompting graduating member to reflect on progress made, takeaways from treatment and plan for stepping down. Counselor and group members shared observations of growth, encouragement and support as they transition out of the program.  [name] participated in this portion of group by sharing that it had been nice getting to know the discharging member and encouraged them to take things one day at a time to avoid becoming overwhelmed.     Assessment and Plan: Counselor recommends that patient remains in IOP treatment to better manage mental health symptoms and continue to address treatment plan goals. Counselor recommends adherence to crisis/safety plan, taking medications as prescribed and following up with medical professionals if any issues arise.    Follow Up Instructions: Counselor will send Webex link for next session.  The patient was advised to call back or seek an in-person evaluation if the symptoms worsen or if the condition fails to improve as anticipated.   I provided 180 minutes of non-face-to-face time during this encounter.     Shade Flood, LCSW, LCAS

## 2019-12-13 ENCOUNTER — Other Ambulatory Visit: Payer: Self-pay

## 2019-12-13 ENCOUNTER — Other Ambulatory Visit (HOSPITAL_COMMUNITY): Payer: BC Managed Care – PPO | Admitting: Licensed Clinical Social Worker

## 2019-12-13 DIAGNOSIS — F329 Major depressive disorder, single episode, unspecified: Secondary | ICD-10-CM | POA: Diagnosis not present

## 2019-12-13 DIAGNOSIS — Z7984 Long term (current) use of oral hypoglycemic drugs: Secondary | ICD-10-CM | POA: Diagnosis not present

## 2019-12-13 DIAGNOSIS — F4310 Post-traumatic stress disorder, unspecified: Secondary | ICD-10-CM | POA: Diagnosis not present

## 2019-12-13 DIAGNOSIS — Z79899 Other long term (current) drug therapy: Secondary | ICD-10-CM | POA: Diagnosis not present

## 2019-12-13 DIAGNOSIS — I10 Essential (primary) hypertension: Secondary | ICD-10-CM | POA: Diagnosis not present

## 2019-12-13 DIAGNOSIS — Z634 Disappearance and death of family member: Secondary | ICD-10-CM | POA: Diagnosis not present

## 2019-12-13 DIAGNOSIS — E282 Polycystic ovarian syndrome: Secondary | ICD-10-CM | POA: Diagnosis not present

## 2019-12-13 DIAGNOSIS — F322 Major depressive disorder, single episode, severe without psychotic features: Secondary | ICD-10-CM

## 2019-12-13 DIAGNOSIS — F419 Anxiety disorder, unspecified: Secondary | ICD-10-CM | POA: Diagnosis not present

## 2019-12-13 DIAGNOSIS — F411 Generalized anxiety disorder: Secondary | ICD-10-CM

## 2019-12-14 ENCOUNTER — Other Ambulatory Visit: Payer: Self-pay

## 2019-12-14 ENCOUNTER — Other Ambulatory Visit (HOSPITAL_COMMUNITY): Payer: BC Managed Care – PPO | Admitting: Licensed Clinical Social Worker

## 2019-12-14 DIAGNOSIS — F411 Generalized anxiety disorder: Secondary | ICD-10-CM

## 2019-12-14 DIAGNOSIS — Z79899 Other long term (current) drug therapy: Secondary | ICD-10-CM | POA: Diagnosis not present

## 2019-12-14 DIAGNOSIS — F4310 Post-traumatic stress disorder, unspecified: Secondary | ICD-10-CM | POA: Diagnosis not present

## 2019-12-14 DIAGNOSIS — F329 Major depressive disorder, single episode, unspecified: Secondary | ICD-10-CM | POA: Diagnosis not present

## 2019-12-14 DIAGNOSIS — Z7984 Long term (current) use of oral hypoglycemic drugs: Secondary | ICD-10-CM | POA: Diagnosis not present

## 2019-12-14 DIAGNOSIS — F419 Anxiety disorder, unspecified: Secondary | ICD-10-CM | POA: Diagnosis not present

## 2019-12-14 DIAGNOSIS — F322 Major depressive disorder, single episode, severe without psychotic features: Secondary | ICD-10-CM

## 2019-12-14 DIAGNOSIS — Z634 Disappearance and death of family member: Secondary | ICD-10-CM | POA: Diagnosis not present

## 2019-12-14 DIAGNOSIS — I10 Essential (primary) hypertension: Secondary | ICD-10-CM | POA: Diagnosis not present

## 2019-12-14 DIAGNOSIS — E282 Polycystic ovarian syndrome: Secondary | ICD-10-CM | POA: Diagnosis not present

## 2019-12-14 NOTE — Progress Notes (Signed)
Virtual Visit via Video Note   I connected with Molly Clements, who prefers to go by Molly Clements" on 12/14/19 at 9:00 AM EST by a video enabled telemedicine application and verified that I am speaking with the correct person using two identifiers.   Location: Patient: Patient Home Provider: [facility] Office   Case Manager discussed the limitations of evaluation and management by telemedicine and the availability of in person appointments during orientation. The patient expressed understanding and agreed to proceed.   History of Present Illness: MDD and GAD   Observations/Objective: Case Manager checked in with all participants to review discharge dates, insurance authorizations, work-related documents and needs for the treatment team. Counselor facilitated a check-in with group members to gauge mood and current functioning as well as identify recent progress towards treatment goals.  Molly Clements, who prefers to go by Molly Clements" presented to group session on time and was alert, oriented x5, with no evidence or self-report of SI/HI or A/V H.  Molly Clements reported scores of 1/10 for depression and 5/10 for anxiety today.  Molly Clements reported that she was feeling okay today, and spent the day before doing some pantry cleaning for her mother.  She reported that her plan today is to go visit a friend out of town and grab them some lunch to repay them for a previous favor.         Counselor provided psychoeducation on personal boundaries today.  Counselor provided worksheet to members on this subject which explained how boundaries can be considered limits and rules we set for ourselves in relationships, and involves the ability to say "No" to unreasonable demands. Suggestions for improving personal boundaries were discussed, including how to use confident body language, being respectful to others, planning ahead regarding how to approach difficult conversations, and learning to reach a compromise when possible.  Members were  encouraged to consider which traits they most often display, and discussion centered upon how this impacts interactions with supports, and whether changes could be made to improve relationships.  Members were also given opportunities to practice responses they could provide when faced with difficult situations (i.e. being asked to do something at a late hour when inconvenient, having a nosy coworker ask personal questions about work leave, etc).  Interventions were effective, as evidenced by Molly Clements participating in discussion on the subject and reporting that she believes she has porous boundaries based upon her upbringing.  Molly Clements reported that she could relate to another group member's experience with having a caretaker role as a child, stating "At 39 years old I was taking care of my siblings and practically raised my parents too.  Now I'm like the matriarch of the family".  Molly Clements reported that this is one reason she moved away from [place] where most of her family still lives, and has begun to work on improving her boundaries as an adult.  Molly Clements reported that she still finds herself being open at work and offering to help people when necessary, but stated "I need to protect myself from being taken advantage of".  Molly Clements also participated in activity by providing an assertive response in an effort to practice saying "No" more easily.       Assessment and Plan: Counselor recommends that patient remains in IOP treatment to better manage mental health symptoms and continue to address treatment plan goals. Counselor recommends adherence to crisis/safety plan, taking medications as prescribed and following up with medical professionals if any issues arise.    Follow Up Instructions: Counselor will  send Webex link for next session.  The patient was advised to call back or seek an in-person evaluation if the symptoms worsen or if the condition fails to improve as anticipated.   I provided 180 minutes of non-face-to-face time  during this encounter.     Shade Flood, LCSW, LCAS

## 2019-12-17 ENCOUNTER — Other Ambulatory Visit (HOSPITAL_COMMUNITY): Payer: BC Managed Care – PPO | Admitting: Licensed Clinical Social Worker

## 2019-12-17 ENCOUNTER — Other Ambulatory Visit: Payer: Self-pay

## 2019-12-17 DIAGNOSIS — F329 Major depressive disorder, single episode, unspecified: Secondary | ICD-10-CM | POA: Diagnosis not present

## 2019-12-17 DIAGNOSIS — F4310 Post-traumatic stress disorder, unspecified: Secondary | ICD-10-CM | POA: Diagnosis not present

## 2019-12-17 DIAGNOSIS — Z7984 Long term (current) use of oral hypoglycemic drugs: Secondary | ICD-10-CM | POA: Diagnosis not present

## 2019-12-17 DIAGNOSIS — Z634 Disappearance and death of family member: Secondary | ICD-10-CM | POA: Diagnosis not present

## 2019-12-17 DIAGNOSIS — F322 Major depressive disorder, single episode, severe without psychotic features: Secondary | ICD-10-CM

## 2019-12-17 DIAGNOSIS — E282 Polycystic ovarian syndrome: Secondary | ICD-10-CM | POA: Diagnosis not present

## 2019-12-17 DIAGNOSIS — F419 Anxiety disorder, unspecified: Secondary | ICD-10-CM | POA: Diagnosis not present

## 2019-12-17 DIAGNOSIS — F411 Generalized anxiety disorder: Secondary | ICD-10-CM

## 2019-12-17 DIAGNOSIS — I10 Essential (primary) hypertension: Secondary | ICD-10-CM | POA: Diagnosis not present

## 2019-12-17 DIAGNOSIS — Z79899 Other long term (current) drug therapy: Secondary | ICD-10-CM | POA: Diagnosis not present

## 2019-12-17 NOTE — Progress Notes (Signed)
Virtual Visit via Video Note   I connected with Molly Clements, who prefers to go by Molly Clements" on 12/17/19 at 9:00 AM EST by a video enabled telemedicine application and verified that I am speaking with the correct person using two identifiers.   Location: Patient: Patient Home Provider: Clinical Home Office   Case Manager discussed the limitations of evaluation and management by telemedicine and the availability of in person appointments during orientation. The patient expressed understanding and agreed to proceed.   History of Present Illness: MDD, GAD   Observations/Objective: Case Manager checked in with all participants to review discharge dates, insurance authorizations, work-related documents and needs for the treatment team. Counselor facilitated a check-in with group members to gauge mood and current functioning as well as identify recent progress towards treatment goals.  Molly Clements, who prefers to go by Molly Clements" presented to group on time and was alert, oriented x5, with no evidence or self-report of SI/HI or A/V H.  Molly Clements reported scores of 0/10 for depression and 3/10 for anxiety today.  Molly Clements introduced herself to a new group member today and discovered that they shared similarly stressful jobs, so she provided resources that might be helpful and welcomed her to group.  Molly Clements reported that she spent her weekend fixing food for family, did some shopping, cleaned the fridge, and was grateful for help from her family to make this process easier.  Molly Clements reported that today she will get her nails done, go to the cleaners, and prepare dinner.         Counselor covered topic of core beliefs with group today.  Counselor provided handout on the subject, which explained how everyone looks at the world differently, and two people can have the same experience, but have different interpretations of what happened.  Members were encouraged to think of these like sunglasses with different "shades" influencing  perception towards positive or negative outcomes.  Examples of negative core beliefs were provided, such as "I'm unlovable", "I'm not good enough", and "I'm a bad person".  Members were asked to identify which one(s) they could relate to, and then consider evidence which contradicts these beliefs.  Molly Clements reported that she could relate to another group member in feeling that they share the core belief of being 'stuck' in current job with little hope for change.  Molly Clements noted that she was happier at her old job, but it didn't make as much money, which is what led to the transition, and a gradual buildup of stress in recent years, which has negatively impacted mental health and led to IOP admission.  Molly Clements reported that [employer] is not everything" to her anymore, and she is looking into other job positions, as well as resume building resources to improve outlook and find a career that might provide financial stability, as well as greater purpose and overall satisfaction.    Counselor also provided psychoeducation on positive affirmations today.  Counselor explained how these are positive statements which can be spoken out loud or recited mentally to challenge negative thoughts and/or core beliefs to improve mood and outlook each day.  Counselor provided a comprehensive list of affirmations to members with different categories, including ones for health, confidence, success, and happiness.  Counselor invited members to look through this list and identify any which resonated with them, and practice saying them out loud with sincerity. Intervention was effective, as evidenced by Molly Clements expressing openness to positive affirmations that might resolve her negative core belief, and noted that she was particularly  fond of "I'm open to all of life's possibilities".      Assessment and Plan: Counselor recommends that patient remains in IOP treatment to better manage mental health symptoms and continue to address treatment plan  goals. Counselor recommends adherence to crisis/safety plan, taking medications as prescribed and following up with medical professionals if any issues arise.    Follow Up Instructions: Counselor will send Webex link for next session.  The patient was advised to call back or seek an in-person evaluation if the symptoms worsen or if the condition fails to improve as anticipated.   I provided 180 minutes of non-face-to-face time during this encounter.     Noralee Stain, LCSW, LCAS

## 2019-12-17 NOTE — Progress Notes (Signed)
Virtual Visit via Video Note  I connected with Angeliki L Schaafsma on 12/13/2019 at  9:00 AM EDT by a video enabled telemedicine application and verified that I am speaking with the correct person using two identifiers.   Case Manager discussed the limitations of evaluation and management by telemedicine and the availability of in person appointments. The patient expressed understanding and agreed to proceed.  Location:  Patient: Patient Home Provider: [facility] Office  History of Present Illness: MDD, GAD  Observations/Objective: Case Manager checked in with all participants to review discharge dates, insurance authorizations, work-related documents and needs for the treatment team. Clinician facilitated a check-in with group members to assess mood and current functioning. Clinician introduced self and prompted client to provide an update on functioning since last group meeting. 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 Client spent time throughout check in relating to others in regard to setting boundaries with family members and what this looks like as society has a standard that you "stick with your blood". Client did not report any SI/HI/psychosis during this engagement.   Initial portion of group was provided by [facility], [place]. Chaplain introduced self her role with joining the IOP group 1x weekly. Chaplain provided education on various types of grief such as anticipatory and disenfranchised grief. Chaplain provided information related to stages of grief and encouraged members to share their personal experiences with grief/loss. Client reported relating to anticipatory grief as she helps to take care of her parents who are aging. Client processed feelings and actively engaged in  discussion with the Chaplain.  Psycho-educational portion of group centered on identifying and relaying needs to others in order to feel supported. Clinician introduced topic and assessed member's ability to relay their needs to others and any potential barriers in doing so. Clinician prompted discussion on the value in relaying needs to others to improve relationships and mood. Clinician shared video titled 'What is Your Love Language' and encouraged members to share their responses. Clinician shared a Love Language quiz both virtually and a handout to group members. Clinician encouraged members to take quiz and then share responses. Clinician utilized open ended questions and CBT to challenge member's thoughts regarding their responses as well as how they can inform others on how to best support them. Client reported that she typically doesn't relay needs to others and tries to handle situations on her own. Client discussed her independence and how this can be harmful as she will overwhelm herself at times. Client identified her love language as quality time and processed how the pandemic has impacted her in not being able to spend time or see others as much as she prefers.   Assessment and Plan: Clinician recommends that patient remains in IOP treatment to better manage mental health symptoms and continue to address treatment plan goals. Clinician recommends adherence to crisis/safety plan, taking medications as prescribed, and following up with medical professionals if any issues arise.  Follow Up Instructions: Clinician will send Webex link for next session. The patient was advised to call back or seek an in-person evaluation if the symptoms worsen or if the condition fails to improve as anticipated.    I provided 180 minutes of non-face-to-face time during this encounter.   Francine Graven, LCSW

## 2019-12-18 ENCOUNTER — Other Ambulatory Visit (HOSPITAL_COMMUNITY): Payer: BC Managed Care – PPO | Admitting: Licensed Clinical Social Worker

## 2019-12-18 ENCOUNTER — Other Ambulatory Visit: Payer: Self-pay

## 2019-12-18 DIAGNOSIS — F322 Major depressive disorder, single episode, severe without psychotic features: Secondary | ICD-10-CM

## 2019-12-18 DIAGNOSIS — F419 Anxiety disorder, unspecified: Secondary | ICD-10-CM | POA: Diagnosis not present

## 2019-12-18 DIAGNOSIS — Z79899 Other long term (current) drug therapy: Secondary | ICD-10-CM | POA: Diagnosis not present

## 2019-12-18 DIAGNOSIS — I10 Essential (primary) hypertension: Secondary | ICD-10-CM | POA: Diagnosis not present

## 2019-12-18 DIAGNOSIS — E282 Polycystic ovarian syndrome: Secondary | ICD-10-CM | POA: Diagnosis not present

## 2019-12-18 DIAGNOSIS — F411 Generalized anxiety disorder: Secondary | ICD-10-CM

## 2019-12-18 DIAGNOSIS — Z634 Disappearance and death of family member: Secondary | ICD-10-CM | POA: Diagnosis not present

## 2019-12-18 DIAGNOSIS — F4310 Post-traumatic stress disorder, unspecified: Secondary | ICD-10-CM | POA: Diagnosis not present

## 2019-12-18 DIAGNOSIS — Z7984 Long term (current) use of oral hypoglycemic drugs: Secondary | ICD-10-CM | POA: Diagnosis not present

## 2019-12-18 DIAGNOSIS — F329 Major depressive disorder, single episode, unspecified: Secondary | ICD-10-CM | POA: Diagnosis not present

## 2019-12-18 NOTE — Progress Notes (Signed)
Virtual Visit via Video Note  I connected with Molly Clements on 12/18/19 at  9:00 AM EDT by a video enabled telemedicine application and verified that I am speaking with the correct person using two identifiers.   Case Manager discussed the limitations of evaluation and management by telemedicine and the availability of in person appointments. The patient expressed understanding and agreed to proceed.  Location:  Patient: Patient Home Provider: [facility] Office  History of Present Illness: MDD, GAD  Observations/Objective: Case Manager checked in with all participants to review discharge dates, insurance authorizations, work-related documents and needs for the treatment team. Clinician facilitated a check-in with group members to assess mood and current functioning. Clinician introduced self and prompted client to provide an update on functioning since last group meeting. Clinician shared guided meditation 'Clear Your Mind' and encouraged members to engage in mindfulness as well as provide feedback on their experience. Client was active throughout check in and provided an update reporting "things are going well". Client discussed recent engagement of self-care yesterday which consisted of her getting her nails done. Client discussed frustration regarding a conversation with a friend which client perceived as a negative statements towards her. Client was receptive to feedback and admitted that it would have been helpful to ask this friend for clarification on how to best support her so that client could do so. Client did not endorse SI/HI/psychosis during this engagement.  Psycho-educational portion of group centered on Cognitive Distortions and unhealthy thinking patterns. Clinician provided oversight on distorted thinking patters and shared video 'Cognitive Distortion's and allowed space for members to share their responses. Clinician provided psycho-educational handouts titled 'Cognitive  Distortions' and 'Challenging Negative Thoughts' and prompted members to identify any distorted thinking patterns that they engage in while processing feelings associated with those thoughts as well as behaviors. Clinician validated members feelings and engaged in discussion on the importance in recognizing these thoughts in order to make change. Clinician provided CBT thought record to assist members in identifying alternate thoughts on their own. Client reported that she identifies with "all of these" cognitive distortions provided on psycho-educational handout. Client reported that she mostly relates to jumping to conclusions as well as catastrophizing and discussed previous experiences that influenced these thinking patterns. Client provided kind words and support to a fellow peer who is graduating from the program today.  Assessment and Plan: Clinician recommends that patient remains in IOP treatment to better manage mental health symptoms and continue to address treatment plan goals. Clinician recommends adherence to crisis/safety plan, taking medications as prescribed, and following up with medical professionals if any issues arise.  Follow Up Instructions: Clinician will send Webex link for next session. The patient was advised to call back or seek an in-person evaluation if the symptoms worsen or if the condition fails to improve as anticipated.   I provided 180 minutes of non-face-to-face time during this encounter.   Francine Graven, LCSW

## 2019-12-19 ENCOUNTER — Other Ambulatory Visit: Payer: Self-pay

## 2019-12-19 ENCOUNTER — Other Ambulatory Visit (HOSPITAL_COMMUNITY): Payer: BC Managed Care – PPO | Admitting: Licensed Clinical Social Worker

## 2019-12-19 DIAGNOSIS — Z634 Disappearance and death of family member: Secondary | ICD-10-CM | POA: Diagnosis not present

## 2019-12-19 DIAGNOSIS — I10 Essential (primary) hypertension: Secondary | ICD-10-CM | POA: Diagnosis not present

## 2019-12-19 DIAGNOSIS — Z79899 Other long term (current) drug therapy: Secondary | ICD-10-CM | POA: Diagnosis not present

## 2019-12-19 DIAGNOSIS — F419 Anxiety disorder, unspecified: Secondary | ICD-10-CM | POA: Diagnosis not present

## 2019-12-19 DIAGNOSIS — F322 Major depressive disorder, single episode, severe without psychotic features: Secondary | ICD-10-CM

## 2019-12-19 DIAGNOSIS — F411 Generalized anxiety disorder: Secondary | ICD-10-CM

## 2019-12-19 DIAGNOSIS — E282 Polycystic ovarian syndrome: Secondary | ICD-10-CM | POA: Diagnosis not present

## 2019-12-19 DIAGNOSIS — F329 Major depressive disorder, single episode, unspecified: Secondary | ICD-10-CM | POA: Diagnosis not present

## 2019-12-19 DIAGNOSIS — F4310 Post-traumatic stress disorder, unspecified: Secondary | ICD-10-CM | POA: Diagnosis not present

## 2019-12-19 DIAGNOSIS — Z7984 Long term (current) use of oral hypoglycemic drugs: Secondary | ICD-10-CM | POA: Diagnosis not present

## 2019-12-19 NOTE — Progress Notes (Signed)
Virtual Visit via Video Note   I connected with Kamaljit Melito, who prefers to go by [name]" on 12/19/19 at 9:00 AM EST by a video enabled telemedicine application and verified that I am speaking with the correct person using two identifiers.   Location: Patient: Patient Home Provider: [facility] Office   Case Manager discussed the limitations of evaluation and management by telemedicine and the availability of in person appointments during orientation. The patient expressed understanding and agreed to proceed.   History of Present Illness: MDD and GAD   Observations/Objective: Case Manager checked in with all participants to review discharge dates, insurance authorizations, work-related documents and needs for the treatment team. Counselor facilitated a check-in with group members to gauge mood and current functioning as well as identify recent progress towards treatment goals.  Tayjah, who prefers to go by [name]" presented to group session on time and was alert, oriented x5, with no evidence or self-report of SI/HI or A/V H.  [name] reported scores of 0/10 for both depression and anxiety today.  [name] reported that yesterday she spent some time going shopping for herself, and noted that she found the previous group to be enlightening, as she wasn't aware of some of the cognitive distortions she tends to struggle with.  She reported that her agenda today is to visit the outpatient office to drop off some paperwork, and take her mother to a doctor's appointment to help out.  [name] reported that one of her personal goals is to practice letting go of the past so that she can maintain positive mood and avoid blaming others for problems she faces day to day.     Counselor provided demonstration of relaxation technique known as mindful breathing to help members increase sense of calm, resiliency, and control.  Counselor guided members through process of getting comfortable, achieving a relaxed breathing  rhythm, and focusing on this for several minutes, allowing troubling thoughts and feelings to come and go without rumination.  Counselor processed effectiveness of activity afterward in discussion with members, including how this impacted their mental state, whether it was difficult to stay focused, and if they plan to include it in self-care routine to improve day-to-day coping.  Intervention was effective, as evidenced by [name] successfully participating in exercise and reporting that she enjoyed it.  [name] reported that it gave her a sense of inner peace, and time to reflect on things, so she will consider adding it to her self-care routine to improve mental balance and manage irritability.      Psycho-educational portion of group was co-facilitated by wellness director (David Stall, MS, MPH, CHES) focused on self-care in daily life. Facilitator and group members discussed presented materials regarding importance of sleep, diet, and exercise. Group members discussed any changes they are willing to make to improve an area of self-care in their lives (physical, psychological, emotional, spiritual, relationship, professional) to improve overall mental health as they continue with treatment.  Chen followed along with speaker during presentation and noted that she would like to improve physical and mental wellbeing by focusing on exercise and diet.  She reported that she has begun a keto diet to manage weight and eat healthier, and is contemplating beginning exercise routine, although she noted she may need a workout partner to help keep her accountable to a regular routine.    Assessment and Plan: Counselor recommends that patient remains in IOP treatment to better manage mental health symptoms and continue to address treatment plan goals. Counselor recommends  adherence to crisis/safety plan, taking medications as prescribed and following up with medical professionals if any issues arise.    Follow Up  Instructions: Counselor will send Webex link for next session.  The patient was advised to call back or seek an in-person evaluation if the symptoms worsen or if the condition fails to improve as anticipated.   I provided 180 minutes of non-face-to-face time during this encounter.     Shade Flood, LCSW, LCAS

## 2019-12-20 ENCOUNTER — Other Ambulatory Visit (HOSPITAL_COMMUNITY): Payer: BC Managed Care – PPO | Admitting: Licensed Clinical Social Worker

## 2019-12-20 ENCOUNTER — Other Ambulatory Visit: Payer: Self-pay

## 2019-12-20 DIAGNOSIS — F411 Generalized anxiety disorder: Secondary | ICD-10-CM

## 2019-12-20 DIAGNOSIS — F329 Major depressive disorder, single episode, unspecified: Secondary | ICD-10-CM | POA: Diagnosis not present

## 2019-12-20 DIAGNOSIS — F4310 Post-traumatic stress disorder, unspecified: Secondary | ICD-10-CM | POA: Diagnosis not present

## 2019-12-20 DIAGNOSIS — I10 Essential (primary) hypertension: Secondary | ICD-10-CM | POA: Diagnosis not present

## 2019-12-20 DIAGNOSIS — Z634 Disappearance and death of family member: Secondary | ICD-10-CM | POA: Diagnosis not present

## 2019-12-20 DIAGNOSIS — F322 Major depressive disorder, single episode, severe without psychotic features: Secondary | ICD-10-CM

## 2019-12-20 DIAGNOSIS — F419 Anxiety disorder, unspecified: Secondary | ICD-10-CM | POA: Diagnosis not present

## 2019-12-20 DIAGNOSIS — Z7984 Long term (current) use of oral hypoglycemic drugs: Secondary | ICD-10-CM | POA: Diagnosis not present

## 2019-12-20 DIAGNOSIS — E282 Polycystic ovarian syndrome: Secondary | ICD-10-CM | POA: Diagnosis not present

## 2019-12-20 DIAGNOSIS — Z79899 Other long term (current) drug therapy: Secondary | ICD-10-CM | POA: Diagnosis not present

## 2019-12-21 ENCOUNTER — Ambulatory Visit (HOSPITAL_COMMUNITY): Payer: BC Managed Care – PPO

## 2019-12-21 ENCOUNTER — Other Ambulatory Visit (HOSPITAL_BASED_OUTPATIENT_CLINIC_OR_DEPARTMENT_OTHER): Payer: BC Managed Care – PPO | Admitting: Psychiatry

## 2019-12-21 DIAGNOSIS — F329 Major depressive disorder, single episode, unspecified: Secondary | ICD-10-CM | POA: Diagnosis not present

## 2019-12-21 DIAGNOSIS — Z7984 Long term (current) use of oral hypoglycemic drugs: Secondary | ICD-10-CM | POA: Diagnosis not present

## 2019-12-21 DIAGNOSIS — F411 Generalized anxiety disorder: Secondary | ICD-10-CM | POA: Diagnosis not present

## 2019-12-21 DIAGNOSIS — F322 Major depressive disorder, single episode, severe without psychotic features: Secondary | ICD-10-CM | POA: Diagnosis not present

## 2019-12-21 DIAGNOSIS — F4310 Post-traumatic stress disorder, unspecified: Secondary | ICD-10-CM | POA: Diagnosis not present

## 2019-12-21 DIAGNOSIS — I10 Essential (primary) hypertension: Secondary | ICD-10-CM | POA: Diagnosis not present

## 2019-12-21 DIAGNOSIS — Z634 Disappearance and death of family member: Secondary | ICD-10-CM | POA: Diagnosis not present

## 2019-12-21 DIAGNOSIS — Z79899 Other long term (current) drug therapy: Secondary | ICD-10-CM | POA: Diagnosis not present

## 2019-12-21 DIAGNOSIS — F419 Anxiety disorder, unspecified: Secondary | ICD-10-CM | POA: Diagnosis not present

## 2019-12-21 DIAGNOSIS — E282 Polycystic ovarian syndrome: Secondary | ICD-10-CM | POA: Diagnosis not present

## 2019-12-21 NOTE — Progress Notes (Signed)
Virtual Visit via Video Note  I connected with Molly Clements on @TODAY @ at  9:00 AM EDT by a video enabled telemedicine application and verified that I am speaking with the correct person using two identifiers.   I discussed the limitations of evaluation and management by telemedicine and the availability of in person appointments. The patient expressed understanding and agreed to proceed.  I discussed the assessment and treatment plan with the patient. The patient was provided an opportunity to ask questions and all were answered. The patient agreed with the plan and demonstrated an understanding of the instructions.   The patient was advised to call back or seek an in-person evaluation if the symptoms worsen or if the condition fails to improve as anticipated.  I provided 20 minutes of non-face-to-face time during this encounter.  Patient ID: , female   DOB: January 10, 1981, 39 y.o.   MRN: [id number] D:Chief Complaint/Presenting Problem: This is a 39 yr old, married, employed, 41 American female who was referred per a coworker/previous pt; treatment for worsening depressive and anxiety symptoms.  Denies SI/HI or A/V hallucinations.  Triggers/Stressors:  1) Job ([employer]) of 17 yrs.  "They are pushing [place] to sale all the time.  They treat you bad and don't respect you."  2)  Feb 2020; pt had COVID-19 among other immediate family members.  3)  Financial Strain d/t tax debt.  Pt states she and husband are trying to purchase a home.  They are currently living with pt's mother, stepfather and niece.  4) Unresolved grief/loss issues:  Nov. 1987 father was murdered; Nov. 2000 brother was murdered.  Pt denies any prior psychiatric hospitalizations.  Hx of seeing Dr. 07-15-1979; was referred to [facility], LCSW but states she never saw her.  Denies any prior suicide attempts or gestures.  Family hx:  M-GM (Depression); Mother (hx of drugs); Deceased father (depression and hx of  drugs)  Pt completed all 15 days in [facility]-IOP.  Pt was very active in all the groups.  Reports that the groups were very helpful.  "It was good to learn the coping skills.  I'm doing good."  Pt rated zero (scale of 1-10 with 10 being the worst); on depression and anxiety.  Denies SI/HI or A/V hallucinations. A:  D/C today.  F/U with [facility] on 01-08-20 @ 4:15 pm and 12-08-1998, LCSW (pt just needs to call for appt).  Encouraged support groups, aftercare group, and Q-App.  R:  Pt receptive.  Hurley Cisco, M.Ed,CNA

## 2019-12-21 NOTE — Progress Notes (Signed)
Virtual Visit via Video Note   I connected with Molly Clements, who prefers to go by Molly Clements" on 12/19/19 at 9:00 AM EST by a video enabled telemedicine application and verified that I am speaking with the correct person using two identifiers.   Location: Patient: Patient Home Provider: [facility] [place] Office   Case Manager discussed the limitations of evaluation and management by telemedicine and the availability of in person appointments during orientation. The patient expressed understanding and agreed to proceed.   History of Present Illness: MDD and GAD   Observations/Objective: Case Manager checked in with all participants to review discharge dates, insurance authorizations, work-related documents and needs for the treatment team. Counselor facilitated a check-in with group members to gauge mood and current functioning as well as identify recent progress towards treatment goals.  Molly Clements, who prefers to go by Molly Clements" presented to group session on time and was alert, oriented x5, with no evidence or self-report of SI/HI or A/V H.  Molly Clements reported scores of 0/10 for both depression and anxiety today.  Molly Clements reported that yesterday she dropped paperwork off at the outpatient clinic ahead of discharge, and ran some errands to stay busy.  Molly Clements reported that her plan today is to run some more errands outside the house, including preparing for a memorial day cookout she will hold with family this Sunday.  Molly Clements reported that she was in an overall good mood today, and isn't too nervous about leaving group today, stating "I've good more coping skills and feel better prepared".     Counselor introduced topic of self-esteem today and defined this as the value an individual places on oneself, based upon assessment of personal worth as a human being and approval/disapproval of one's behavior. Counselor asked members to assess their level of self-esteem at this time based upon common indicators of high self-esteem,  including: accepting oneself unconditionally;  having self-respect and deep seated belief that one matters; being unaffected by other people's opinions/criticisms; and showing good control over emotions.  Counselor also explained concept of one's inner critic which serves to highlight faults and minimize strengths, directly influencing low sense of self-esteem.  Molly Clements reported that although her self-esteem is not necessarily bad, she acknowledged that she could improve it, and as a result, she would feel more happy, and less apprehensive to take on major challenges, such as returning to school, or changing careers.  She reported that she believes more confidence would reduce depression and anxiety as well, and lead her inner critic to quiet down and become more supportive.   Counselor then provided handout on 'strengths and qualities', which featured questions to guide discussion and increase awareness of each member's unique individual abilities which could reinforce higher self-esteem. Examples of questions included: 'things I am good at', 'challenges I have overcome', and 'what I like about myself'.  Molly Clements reported that she has several notable skills which improve self-esteem to consider, including cleaning, organizing, holding events for family, and acknowledged that these could all translate into business management if she chose to move in that direction for professional development.  Molly Clements reported that "It makes me happy to help others successfully" and "I need to give myself more credit for changes that I've been making".  Molly Clements reported that she will try to motivate and be more supportive of herself moving forward by highlighting these personal strengths, as well as engaging in more positive self-talk each day.        Counselor ended session by acknowledging a graduating  group member by prompting graduating member to reflect on progress made, takeaways from treatment and plan for stepping down. Counselor and  group members shared observations of growth, encouragement and support as they transition out of the program. Molly Clements reported that her completion today was somewhat bittersweet after hearing what other members had to say, and acknowledged that she would miss them, since they made her feel more comfortable opening up, and validating her struggles.  Molly Clements reported that attending group helped her develop a routine, learn new coping skills, and stay "Grounded in reality".  She encouraged remaining members to "Love yourself, be patient, and stay motivated".    Assessment and Plan: Counselor recommends that patient remains in IOP treatment to better manage mental health symptoms and continue to address treatment plan goals. Counselor recommends adherence to crisis/safety plan, taking medications as prescribed and following up with medical professionals if any issues arise.    Follow Up Instructions: Counselor will send Webex link for next session.  The patient was advised to call back or seek an in-person evaluation if the symptoms worsen or if the condition fails to improve as anticipated.   I provided 180 minutes of non-face-to-face time during this encounter.     Noralee Stain, LCSW, LCAS

## 2019-12-21 NOTE — Progress Notes (Signed)
Virtual Visit via Video Note  I connected with Molly Clements on 12/21/19 at  9:00 AM EDT by a video enabled telemedicine application and verified that I am speaking with the correct person using two identifiers.   I discussed the limitations of evaluation and management by telemedicine and the availability of in person appointments. The patient expressed understanding and agreed to proceed.   I discussed the assessment and treatment plan with the patient. The patient was provided an opportunity to ask questions and all were answered. The patient agreed with the plan and demonstrated an understanding of the instructions.   The patient was advised to call back or seek an in-person evaluation if the symptoms worsen or if the condition fails to improve as anticipated.  I provided 15 minutes of non-face-to-face time during this encounter.   Oneta Rack, NP    [facility] Discharge Summary  Molly Clements [id number]  Admission date: 12/03/2019 Discharge date: 12/21/2019  Reason for admission: per admission assessment note:Molly Clements is a 39 year old African-American female who presents with worsening depression and anxiety due to reported work-related stressors.  Reports she is currently employed by [employer].  She reports over the past 3 years her current work environment has changed dramatically.  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  States the new demands of her job has caused worsening depression and undue stress. Reported mental health history with anxiety and depression.  Where she reports she is prescribed Xanax and Adderall and mirtazapine?  Patient reports she is followed by Simmie Davies currently for medication management.  States she is followed by therapist however did not keep her follow-up appointment.   Progress in Program Toward Treatment Goals: Ongoing, patient attended and  participated with daily group session with active and engaged participation.  At discharge she is denying suicidal or homicidal ideation.  Denies auditory or visual hallucinations.  Citing " I feel better overall."  Patient does continue to endorse slight anxiety with returning back to work.  Declined any medication refills at this time.  Patient to keep all follow-up appointment with outpatient providers.  Support encouragement reassurance was provided.  Progress (rationale): Patient to keep follow-up with Dr. Milagros Evener on 01-08-20 and Hurley Cisco, LCSW.- patient to follow-up with appotiment   Take all medications as prescribed. Keep all follow-up appointments as scheduled.  Do not consume alcohol or use illegal drugs while on prescription medications. Report any adverse effects from your medications to your primary care provider promptly.  In the event of recurrent symptoms or worsening symptoms, call 911, a crisis hotline, or go to the nearest emergency department for evaluation.     Oneta Rack, NP 12/21/2019

## 2019-12-21 NOTE — Patient Instructions (Signed)
D:  Patient completed MH-IOP today.  A:  Follow up with Dr. Milagros Evener on 01-08-20 @ 4:15 pm (virtual) and pt is to contact the office for an appt with Hurley Cisco, LCSW.  Encouraged support groups, aftercare group with Hilbert Odor, LCSW and Q-app.  R:  Patient receptive.

## 2019-12-22 ENCOUNTER — Encounter (HOSPITAL_COMMUNITY): Payer: Self-pay | Admitting: Psychiatry

## 2019-12-25 NOTE — Progress Notes (Addendum)
Virtual Visit via Video Note  I connected with Molly Clements on 12/20/19 at  9:00 AM EDT by a video enabled telemedicine application and verified that I am speaking with the correct person using two identifiers.   Case Manager discussed the limitations of evaluation and management by telemedicine and the availability of in person appointments. The patient expressed understanding and agreed to proceed.  Location:  Patient: Patient Home Provider: [facility] Office  History of Present Illness: MDD and GAD  Observations/Objective: Case Manager checked in with all participants to review discharge dates, insurance authorizations, work-related documents and needs for the treatment team. Clinician facilitated a check-in with group members to assess mood and current functioning. Clinician introduced self and prompted client to provide an update on functioning since last group session. Client checked in by providing an update and reported "things are going well". Client relayed she has been planning events for the approaching Holiday weekend in which she plans to spend time with family members. Client relayed that she is graduating from this program tomorrow and thanked the group members as well as this clinician for relating and providing her with such helpful feedback throughout her time in the program. Client did not endorse any current SI/HI/psychosis.  Initial portion of group was provided by [facility], [place]. Chaplain introduced self her role with joining the IOP group 1x weekly. Chaplain provided education on various types of grief such as anticipatory and disenfranchised grief. Chaplain provided information related to stages of grief and encouraged members to share their personal experiences with grief/loss.   Second portion of group discussion was provided by [facility] with [facility]. Alex introduced self her role with joining the IOP group to provide  psycho-education on specific services and programs offered within [employer]. Alex allowed space for members to ask questions and inquire on specific programs that they are interested in. Clinician followed up with members following Alex's presentation and facilitated discussion on which programs they plan to look into or inquire on. Clinician assessed for 1 self-care activity prior to tomorrow's group. Client reported that she plans to take care of herself today by running errands and going for a walk.  Assessment and Plan: Clinician recommends that patient remains in IOP treatment to better manage mental health symptoms and continue to address treatment plan goals. Clinician recommends adherence to crisis/safety plan, taking medications as prescribed, and following up with medical professionals if any issues arise.  Follow Up Instructions: Clinician will send Webex link for next session. The patient was advised to call back or seek an in-person evaluation if the symptoms worsen or if the condition fails to improve as anticipated.   I provided 180 minutes of non-face-to-face time during this encounter.   Francine Graven, LCSW

## 2020-01-07 ENCOUNTER — Encounter (HOSPITAL_COMMUNITY): Payer: Self-pay

## 2020-01-07 DIAGNOSIS — F411 Generalized anxiety disorder: Secondary | ICD-10-CM

## 2020-01-07 NOTE — BH Assessment (Signed)
Q-Actual   Writer attempted to contact the participant in order to complete a PHQ 2-9.  Writer was unsuccessful in reaching the patient was only able to leave a voice mail message.

## 2020-01-18 ENCOUNTER — Encounter: Payer: Self-pay | Admitting: General Practice

## 2020-01-29 ENCOUNTER — Encounter (HOSPITAL_COMMUNITY): Payer: Self-pay

## 2020-01-29 DIAGNOSIS — F411 Generalized anxiety disorder: Secondary | ICD-10-CM

## 2020-01-29 NOTE — BH Assessment (Signed)
Q-Actual   Writer attempted to contact the participant in order to complete a PHQ 2-9.  Writer was unsuccessful in reaching the patient.  

## 2020-02-06 ENCOUNTER — Telehealth (HOSPITAL_COMMUNITY): Payer: Self-pay | Admitting: Psychiatry

## 2020-02-06 NOTE — Telephone Encounter (Signed)
D:  Pt phoned to inquire about short term disability paperwork.  Pt started MH-IOP on 12-03-19 and D/C'd on 12-21-19.  RTW on 01-14-20. It appears that no paperwork was completed while pt was in MH-IOP.  A:  Encouraged pt to check back with Dr. Milagros Evener to see if she would cover the time pt was in MH-IOP; if not pt can forward the forms to case manager and it will be back dated.  Informed Caryn Bee, NP.  R:  Pt receptive.

## 2020-02-21 ENCOUNTER — Ambulatory Visit (INDEPENDENT_AMBULATORY_CARE_PROVIDER_SITE_OTHER): Payer: BC Managed Care – PPO | Admitting: Cardiovascular Disease

## 2020-02-21 ENCOUNTER — Other Ambulatory Visit: Payer: Self-pay

## 2020-02-21 ENCOUNTER — Encounter: Payer: Self-pay | Admitting: Cardiovascular Disease

## 2020-02-21 VITALS — BP 204/90 | HR 109 | Ht 65.0 in | Wt 275.0 lb

## 2020-02-21 DIAGNOSIS — Z6841 Body Mass Index (BMI) 40.0 and over, adult: Secondary | ICD-10-CM | POA: Diagnosis not present

## 2020-02-21 DIAGNOSIS — I10 Essential (primary) hypertension: Secondary | ICD-10-CM

## 2020-02-21 MED ORDER — LOSARTAN POTASSIUM-HCTZ 100-25 MG PO TABS: 1.0000 | ORAL_TABLET | Freq: Every day | ORAL | 1 refills | Status: DC

## 2020-02-21 MED ORDER — NIFEDIPINE ER OSMOTIC RELEASE 90 MG PO TB24: 90.0000 mg | ORAL_TABLET | Freq: Every day | ORAL | 1 refills | Status: DC

## 2020-02-21 NOTE — Patient Instructions (Addendum)
Medication Instructions:  START NIFEDIPINE 90 MG DAILY    Labwork: FASTING LP/CMET/A1C/CORTISOL SOON    Testing/Procedures: NONE   Follow-Up: 1 MONTH WITH PHARM D 03/25/2020 AT 2:30   You will receive a phone call from the PREP exercise and nutrition program to schedule an initial assessment.   Special Instructions:   ONCE YOU GET YOUR BLOOD PRESSURE MACHINE MONITOR TWICE A DAY, LOG IN BOOK PROVIDED. BRING BOOK AND MACHINE TO FOLLOW UP IN 1 MONTH  DASH Eating Plan DASH stands for "Dietary Approaches to Stop Hypertension." The DASH eating plan is a healthy eating plan that has been shown to reduce high blood pressure (hypertension). It may also reduce your risk for type 2 diabetes, heart disease, and stroke. The DASH eating plan may also help with weight loss. What are tips for following this plan?  General guidelines  Avoid eating more than 2,300 mg (milligrams) of salt (sodium) a day. If you have hypertension, you may need to reduce your sodium intake to 1,500 mg a day.  Limit alcohol intake to no more than 1 drink a day for nonpregnant women and 2 drinks a day for men. One drink equals 12 oz of beer, 5 oz of wine, or 1 oz of hard liquor.  Work with your health care provider to maintain a healthy body weight or to lose weight. Ask what an ideal weight is for you.  Get at least 30 minutes of exercise that causes your heart to beat faster (aerobic exercise) most days of the week. Activities may include walking, swimming, or biking.  Work with your health care provider or diet and nutrition specialist (dietitian) to adjust your eating plan to your individual calorie needs. Reading food labels   Check food labels for the amount of sodium per serving. Choose foods with less than 5 percent of the Daily Value of sodium. Generally, foods with less than 300 mg of sodium per serving fit into this eating plan.  To find whole grains, look for the word "whole" as the first word in the  ingredient list. Shopping  Buy products labeled as "low-sodium" or "no salt added."  Buy fresh foods. Avoid canned foods and premade or frozen meals. Cooking  Avoid adding salt when cooking. Use salt-free seasonings or herbs instead of table salt or sea salt. Check with your health care provider or pharmacist before using salt substitutes.  Do not fry foods. Cook foods using healthy methods such as baking, boiling, grilling, and broiling instead.  Cook with heart-healthy oils, such as olive, canola, soybean, or sunflower oil. Meal planning  Eat a balanced diet that includes: ? 5 or more servings of fruits and vegetables each day. At each meal, try to fill half of your plate with fruits and vegetables. ? Up to 6-8 servings of whole grains each day. ? Less than 6 oz of lean meat, poultry, or fish each day. A 3-oz serving of meat is about the same size as a deck of cards. One egg equals 1 oz. ? 2 servings of low-fat dairy each day. ? A serving of nuts, seeds, or beans 5 times each week. ? Heart-healthy fats. Healthy fats called Omega-3 fatty acids are found in foods such as flaxseeds and coldwater fish, like sardines, salmon, and mackerel.  Limit how much you eat of the following: ? Canned or prepackaged foods. ? Food that is high in trans fat, such as fried foods. ? Food that is high in saturated fat, such as fatty meat. ?  Sweets, desserts, sugary drinks, and other foods with added sugar. ? Full-fat dairy products.  Do not salt foods before eating.  Try to eat at least 2 vegetarian meals each week.  Eat more home-cooked food and less restaurant, buffet, and fast food.  When eating at a restaurant, ask that your food be prepared with less salt or no salt, if possible. What foods are recommended? The items listed may not be a complete list. Talk with your dietitian about what dietary choices are best for you. Grains Whole-grain or whole-wheat bread. Whole-grain or whole-wheat  pasta. Brown rice. Orpah Cobb. Bulgur. Whole-grain and low-sodium cereals. Pita bread. Low-fat, low-sodium crackers. Whole-wheat flour tortillas. Vegetables Fresh or frozen vegetables (raw, steamed, roasted, or grilled). Low-sodium or reduced-sodium tomato and vegetable juice. Low-sodium or reduced-sodium tomato sauce and tomato paste. Low-sodium or reduced-sodium canned vegetables. Fruits All fresh, dried, or frozen fruit. Canned fruit in natural juice (without added sugar). Meat and other protein foods Skinless chicken or [place]. Ground chicken or [place]. Pork with fat trimmed off. Fish and seafood. Egg whites. Dried beans, peas, or lentils. Unsalted nuts, nut butters, and seeds. Unsalted canned beans. Lean cuts of beef with fat trimmed off. Low-sodium, lean deli meat. Dairy Low-fat (1%) or fat-free (skim) milk. Fat-free, low-fat, or reduced-fat cheeses. Nonfat, low-sodium ricotta or cottage cheese. Low-fat or nonfat yogurt. Low-fat, low-sodium cheese. Fats and oils Soft margarine without trans fats. Vegetable oil. Low-fat, reduced-fat, or light mayonnaise and salad dressings (reduced-sodium). Canola, safflower, olive, soybean, and sunflower oils. Avocado. Seasoning and other foods Herbs. Spices. Seasoning mixes without salt. Unsalted popcorn and pretzels. Fat-free sweets. What foods are not recommended? The items listed may not be a complete list. Talk with your dietitian about what dietary choices are best for you. Grains Baked goods made with fat, such as croissants, muffins, or some breads. Dry pasta or rice meal packs. Vegetables Creamed or fried vegetables. Vegetables in a cheese sauce. Regular canned vegetables (not low-sodium or reduced-sodium). Regular canned tomato sauce and paste (not low-sodium or reduced-sodium). Regular tomato and vegetable juice (not low-sodium or reduced-sodium). Rosita Fire. Olives. Fruits Canned fruit in a light or heavy syrup. Fried fruit. Fruit in cream or  butter sauce. Meat and other protein foods Fatty cuts of meat. Ribs. Fried meat. Tomasa Blase. Sausage. Bologna and other processed lunch meats. Salami. Fatback. Hotdogs. Bratwurst. Salted nuts and seeds. Canned beans with added salt. Canned or smoked fish. Whole eggs or egg yolks. Chicken or [place] with skin. Dairy Whole or 2% milk, cream, and half-and-half. Whole or full-fat cream cheese. Whole-fat or sweetened yogurt. Full-fat cheese. Nondairy creamers. Whipped toppings. Processed cheese and cheese spreads. Fats and oils Butter. Stick margarine. Lard. Shortening. Ghee. Bacon fat. Tropical oils, such as coconut, palm kernel, or palm oil. Seasoning and other foods Salted popcorn and pretzels. Onion salt, garlic salt, seasoned salt, table salt, and sea salt. Worcestershire sauce. Tartar sauce. Barbecue sauce. Teriyaki sauce. Soy sauce, including reduced-sodium. Steak sauce. Canned and packaged gravies. Fish sauce. Oyster sauce. Cocktail sauce. Horseradish that you find on the shelf. Ketchup. Mustard. Meat flavorings and tenderizers. Bouillon cubes. Hot sauce and Tabasco sauce. Premade or packaged marinades. Premade or packaged taco seasonings. Relishes. Regular salad dressings. Where to find more information:  National Heart, Lung, and Blood Institute: PopSteam.is  American Heart Association: www.heart.org Summary  The DASH eating plan is a healthy eating plan that has been shown to reduce high blood pressure (hypertension). It may also reduce your risk for type 2 diabetes,  heart disease, and stroke.  With the DASH eating plan, you should limit salt (sodium) intake to 2,300 mg a day. If you have hypertension, you may need to reduce your sodium intake to 1,500 mg a day.  When on the DASH eating plan, aim to eat more fresh fruits and vegetables, whole grains, lean proteins, low-fat dairy, and heart-healthy fats.  Work with your health care provider or diet and nutrition specialist (dietitian) to  adjust your eating plan to your individual calorie needs. This information is not intended to replace advice given to you by your health care provider. Make sure you discuss any questions you have with your health care provider. Document Released: 07/01/2011 Document Revised: 06/24/2017 Document Reviewed: 07/05/2016 Elsevier Patient Education  2020 [employer].

## 2020-02-21 NOTE — Progress Notes (Signed)
Hypertension Clinic Initial Assessment:    Date:  03/31/2020   ID:  Molly Clements, DOB [birth year], MRN [id number]  PCP:  System, Pcp Not In  Cardiologist:  No primary care provider on file.  Nephrologist:  Referring MD: Servando Salina, MD   CC: Hypertension  History of Present Illness:    Molly Clements is a 39 y.o. female with a hx of hypertension, despression and anxiety here to establish care in the hypertension clinic. She was first diagnosed with hypertension about 10 years ago.  Her BP has not ever been well-controlled.  She saw Dr. Garwin Brothers on 11/19/19 and her BP was 160/90.  She was referred to the advanced hypertension clinic for further management and to consider the appropriate medications as she is interested in getting pregnant.  She has been on losartan/HCTZ for many years.  She does not check her blood pressure at home.  She isn't very active at baseline but started going to the gym a few times last week for 20 minutes and felt well.  She is also trying to walk.  She gets short of breath with exertion.  She has no exertional chest pain or pressure.  She denies lower extremity edema, orthopnea, or PND.  She did the keto diet last year and was successful.  She would like to try and lose weight.  She struggles with sodium intake.  She has a prescription for Adderall but has not been taking it very much since she has not been working lately.  She snores but does not think she has apneic episodes.  She feels generally well rested in the mornings.  She rarely drinks coffee and does not drink any soda.  She rarely uses Aleve.  Previous antihypertensives: Losartan/hctz  Past Medical History:  Diagnosis Date  . Anxiety   . Depression   . Hypertension   . Migraine   . PCOS (polycystic ovarian syndrome)     Past Surgical History:  Procedure Laterality Date  . ENDOMETRIAL BIOPSY  2008   polypectomy    Current Medications: Current Meds  Medication Sig  . Blood  Glucose Monitoring Suppl (BLOOD GLUCOSE METER) kit Use to test blood sugar twice daily.  Marland Kitchen losartan-hydrochlorothiazide (HYZAAR) 100-25 MG tablet Take 1 tablet by mouth daily.  . [DISCONTINUED] benzonatate (TESSALON) 100 MG capsule Take 1 capsule (100 mg total) by mouth 3 (three) times daily as needed for cough.  . [DISCONTINUED] glipiZIDE (GLUCOTROL) 10 MG tablet Take 1 tablet (10 mg total) by mouth 2 (two) times daily before a meal.  . [DISCONTINUED] glucose blood test strip Use to test blood sugar twice daily.  . [DISCONTINUED] HYDROcodone-homatropine (HYCODAN) 5-1.5 MG/5ML syrup Take 5 mLs by mouth at bedtime as needed.  . [DISCONTINUED] Lancets MISC Use to test blood sugar twice daily.  . [DISCONTINUED] losartan-hydrochlorothiazide (HYZAAR) 100-25 MG tablet Take 1 tablet by mouth daily.  . [DISCONTINUED] metFORMIN (GLUCOPHAGE) 1000 MG tablet Take 1 tablet (1,000 mg total) by mouth 2 (two) times daily with a meal.     Allergies:   Bee venom   Social History   Socioeconomic History  . Marital status: Married    Spouse name: n/a  . Number of children: 0  . Years of education: 13.5  . Highest education level: Not on file  Occupational History  . Occupation: customer service  Tobacco Use  . Smoking status: Never Smoker  . Smokeless tobacco: Never Used  Substance and Sexual Activity  . Alcohol use: No  .  Drug use: No  . Sexual activity: Yes    Partners: Male    Birth control/protection: Pill  Other Topics Concern  . Not on file  Social History Narrative   Lives with fiance Juan Quam   Social Determinants of Health   Financial Resource Strain:   . Difficulty of Paying Living Expenses: Not on file  Food Insecurity:   . Worried About Charity fundraiser in the Last Year: Not on file  . Ran Out of Food in the Last Year: Not on file  Transportation Needs:   . Lack of Transportation (Medical): Not on file  . Lack of Transportation (Non-Medical): Not on file  Physical  Activity:   . Days of Exercise per Week: Not on file  . Minutes of Exercise per Session: Not on file  Stress:   . Feeling of Stress : Not on file  Social Connections:   . Frequency of Communication with Friends and Family: Not on file  . Frequency of Social Gatherings with Friends and Family: Not on file  . Attends Religious Services: Not on file  . Active Member of Clubs or Organizations: Not on file  . Attends Archivist Meetings: Not on file  . Marital Status: Not on file     Family History: The patient's family history includes Atrial fibrillation in her mother; Cancer in her paternal grandmother; Depression in her father; Drug abuse in her father; Heart failure in her mother; Hypertension in her maternal grandfather and mother; Obesity in her sister; Stroke in her maternal grandmother.  ROS:   Please see the history of present illness.    All other systems reviewed and are negative.  EKGs/Labs/Other Studies Reviewed:    EKG:  EKG is ordered today.  02/21/20: Sinus tachycardia.  Rate 109 bpm.  Inferolateral ST abnormality.  Cannot rule out prior anteroseptal infarct  Recent Labs: No results found for requested labs within last 8760 hours.   Recent Lipid Panel    Component Value Date/Time   CHOL 166 03/23/2015 1028   TRIG 125 03/23/2015 1028   HDL 39 (L) 03/23/2015 1028   CHOLHDL 4.3 03/23/2015 1028   VLDL 25 03/23/2015 1028   LDLCALC 102 03/23/2015 1028    Physical Exam:    VS:  BP (!) 204/90   Pulse (!) 109   Ht 5' 5"  (1.651 m)   Wt (!) 275 lb (124.7 kg)   SpO2 97%   BMI 45.76 kg/m  , BMI Body mass index is 45.76 kg/m. GENERAL:  Well appearing HEENT: Pupils equal round and reactive, fundi not visualized, oral mucosa unremarkable NECK:  No jugular venous distention, waveform within normal limits, carotid upstroke brisk and symmetric, no bruits, no thyromegaly LYMPHATICS:  No cervical adenopathy LUNGS:  Clear to auscultation bilaterally HEART:  RRR.   PMI not displaced or sustained,S1 and S2 within normal limits, no S3, no S4, no clicks, no rubs, no murmurs ABD:  Flat, positive bowel sounds normal in frequency in pitch, no bruits, no rebound, no guarding, no midline pulsatile mass, no hepatomegaly, no splenomegaly EXT:  2 plus pulses throughout, no edema, no cyanosis no clubbing SKIN:  No rashes no nodules NEURO:  Cranial nerves II through XII grossly intact, motor grossly intact throughout PSYCH:  Cognitively intact, oriented to person place and time  ASSESSMENT:    1. Essential hypertension   2. BMI 45.0-49.9, adult ([place])   3. Morbid obesity ([place])     PLAN:    #  Essential hypertension:  Ms. Facey BP remains poorly controlled.  It was elevated initially and even higher on repeat.  She is interested in being pregnant and ideally wouldn't be on losartan or HCTZ.  Her BP is quite high so I don't feel comfortable stopping medications at this time.  We will start nifedipine 72m daily.  Continue losartan/HCTZ for now.  Plan to switch it to labetalol at follow up.  She is interested in enrolling in the PREP exercise and nutrition program through the [facility] Limit Aleve.  Secondary Causes of Hypertension  Medications/Herbal: OCP, steroids, stimulants, antidepressants, weight loss medication, immune suppressants, NSAIDs, sympathomimetics, alcohol, caffeine, licorice, ginseng, St. John's wort, chemo  Sleep Apnea- (testing not indicated) Renal artery stenosis  (testing not indicated) Hyperaldosteronism (testing not indicated) Hyper/hypothyroidism Pheochromocytoma: palpitations, tachycardia, headache, diaphoresis (plasma metanephrines) Cushing's syndrome:  (check AM cortisol) Coarctation of the aorta: BP symmetric  # CV Disease Prevention:  Patient has PCOS.  Will check fasting lipids, CMP and hemoglobin A1c.   Disposition:    FU with MD/PharmD in 1 month    Medication Adjustments/Labs and Tests Ordered: Current medicines are  reviewed at length with the patient today.  Concerns regarding medicines are outlined above.  Orders Placed This Encounter  Procedures  . Lipid panel  . Comprehensive metabolic panel  . Cortisol  . HgB A1c  . EKG 12-Lead   Meds ordered this encounter  Medications  . NIFEdipine (PROCARDIA XL/NIFEDICAL-XL) 90 MG 24 hr tablet    Sig: Take 1 tablet (90 mg total) by mouth daily.    Dispense:  90 tablet    Refill:  1  . losartan-hydrochlorothiazide (HYZAAR) 100-25 MG tablet    Sig: Take 1 tablet by mouth daily.    Dispense:  90 tablet    Refill:  1     Signed, TSkeet Latch MD  03/31/2020 9:47 AM    CJette

## 2020-02-26 ENCOUNTER — Telehealth: Payer: Self-pay

## 2020-02-26 NOTE — Telephone Encounter (Signed)
Called pt reference referral to PREP from HTN clinic Explained program. Interested and can do daytime classes.  Will call back with next start date and days and see if that works for pt.  Given number for contact.

## 2020-03-21 ENCOUNTER — Telehealth: Payer: Self-pay

## 2020-03-21 NOTE — Telephone Encounter (Signed)
Left VM regarding next PREP class starting on 04/08/20.  Request call back to discuss interest in participating.

## 2020-03-25 ENCOUNTER — Other Ambulatory Visit: Payer: Self-pay

## 2020-03-25 ENCOUNTER — Ambulatory Visit (INDEPENDENT_AMBULATORY_CARE_PROVIDER_SITE_OTHER): Payer: BC Managed Care – PPO | Admitting: Pharmacist Clinician (PhC)/ Clinical Pharmacy Specialist

## 2020-03-25 DIAGNOSIS — I10 Essential (primary) hypertension: Secondary | ICD-10-CM | POA: Diagnosis not present

## 2020-03-25 MED ORDER — LABETALOL HCL 200 MG PO TABS: 200.0000 mg | ORAL_TABLET | Freq: Two times a day (BID) | ORAL | 3 refills | Status: DC

## 2020-03-25 NOTE — Progress Notes (Signed)
03/26/2020 Molly Clements 01/10/1981 [id number]   HPI:  Molly Clements is a 39 y.o. female patient of Dr Oval Linsey, with a [place] below who presents today for advanced hypertension clinic follow up.  Medical history significant for DM2 (we have no labs), PCOS and prior migraines.   At her visit with Dr. Oval Linsey last month, her pressure was 204/90.  She was referred due to elevated pressure as well as a desire to consider pregnancy.   Due to such a high pressure the losartan hct was continued for now, and nifedipine XL 90 mg once daily.    She returns today for follow up.  Her pressure has dropped significantly since starting the nifedipine xl, however is still not to goal.  She has not had any concerns about either medications.  She believes some of her elevated pressure is due to stresses, and she is trying to improve those.  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        Blood Pressure Goal:  130/80  Current Medications: losartan hctz 100/25 mg qd, nifedipine xl 90 mg qd  Family Hx: both parents living - mother has AF, CHF now 69; as do multiple siblings of mother; father died at 89, his family strong cancer hx;  3 brothers healthy - except obesity in 1-2,   Social Hx: no tobacco, alcohol, caffeine  Diet: mix of eating out/home cooked; tonight roast in crock pot;   Exercise:  None - has membership to [employer], but has not used much; has been contacted by [employer] program to start with them   Home BP readings: home Omron cuff with blue tooth and app.  35 readings over past 18 days shows average of 161/100 with little variation between mornings and evenings.  Range 127-181/77-120.  HR between 85-108.  Intolerances: nkda  Labs: no recent labs, will have drawn in next 1-2 weeks  Wt Readings from Last 3 Encounters:  03/25/20 274 lb  12.8 oz (124.6 kg)  02/21/20 (!) 275 lb (124.7 kg)  09/11/19 250 lb (113.4 kg)   BP Readings from Last 3 Encounters:  03/25/20 (!) 156/78  02/21/20 (!) 204/90  09/11/19 (!) 142/80   Pulse Readings from Last 3 Encounters:  03/25/20 (!) 115  02/21/20 (!) 109  09/11/19 94    Current Outpatient Medications  Medication Sig Dispense Refill   Blood Glucose Monitoring Suppl (BLOOD GLUCOSE METER) kit Use to test blood sugar twice daily. 1 each 0   losartan-hydrochlorothiazide (HYZAAR) 100-25 MG tablet Take 1 tablet by mouth daily. 90 tablet 1   NIFEdipine (PROCARDIA XL/NIFEDICAL-XL) 90 MG 24 hr tablet Take 1 tablet (90 mg total) by mouth daily. 90 tablet 1   labetalol (NORMODYNE) 200 MG tablet Take 1 tablet (200 mg total) by mouth 2 (two) times daily. 60 tablet 3   No current facility-administered medications for this visit.    Allergies  Allergen Reactions   Bee Venom Anaphylaxis    Hives swelling    Past Medical History:  Diagnosis Date   Anxiety    Depression    Hypertension    Migraine    PCOS (polycystic ovarian syndrome)     Blood pressure (!) 156/78, pulse (!) 115, height _0  (1.651 m), weight 274 lb 12.8 oz (124.6 kg), last menstrual period  03/25/2020, SpO2 97 %.  Essential hypertension Patient with systolic/diastolic hypertension, doing better, but not to goal.  Patient is actively trying to become pregnant.  We will have her start labetalol 100 mg twice daily for 1 week then increase to 200 mg bid.  She will continue the losartan hctz just for 1 week, then stop when at the full dose of labetalol (she is currently not pregnant and expects ovulation in 7-10 days).  She is to continue with home BP monitoring and was encouraged to start with PREP.  We will see her back in the office in 1 month.     Tommy Medal PharmD CPP [facility] [address] [place], [place] [address] [phone]

## 2020-03-25 NOTE — Patient Instructions (Signed)
  Check your blood pressure at home daily (if able) and keep record of the readings.  Take your BP meds as follows:  Start labetalol 1/2 tablet twice daily for one week, then increase to 1 tablet twice daily  Continue with losartan/hctz for one week then stop.  DO NOT TAKE WHILE PREGNANT OR TRYING  Continue with nifedipine xl 90 mg once daily  Bring all of your meds, your BP cuff and your record of home blood pressures to your next appointment.  Exercise as you're able, try to walk approximately 30 minutes per day.  Keep salt intake to a minimum, especially watch canned and prepared boxed foods.  Eat more fresh fruits and vegetables and fewer canned items.  Avoid eating in fast food restaurants.    HOW TO TAKE YOUR BLOOD PRESSURE: . Rest 5 minutes before taking your blood pressure. .  Don't smoke or drink caffeinated beverages for at least 30 minutes before. . Take your blood pressure before (not after) you eat. . Sit comfortably with your back supported and both feet on the floor (don't cross your legs). . Elevate your arm to heart level on a table or a desk. . Use the proper sized cuff. It should fit smoothly and snugly around your bare upper arm. There should be enough room to slip a fingertip under the cuff. The bottom edge of the cuff should be 1 inch above the crease of the elbow. . Ideally, take 3 measurements at one sitting and record the average.

## 2020-03-26 ENCOUNTER — Encounter: Payer: Self-pay | Admitting: Pharmacist Clinician (PhC)/ Clinical Pharmacy Specialist

## 2020-03-26 NOTE — Assessment & Plan Note (Signed)
Patient with systolic/diastolic hypertension, doing better, but not to goal.  Patient is actively trying to become pregnant.  We will have her start labetalol 100 mg twice daily for 1 week then increase to 200 mg bid.  She will continue the losartan hctz just for 1 week, then stop when at the full dose of labetalol (she is currently not pregnant and expects ovulation in 7-10 days).  She is to continue with home BP monitoring and was encouraged to start with PREP.  We will see her back in the office in 1 month.

## 2020-03-31 ENCOUNTER — Encounter: Payer: Self-pay | Admitting: Cardiovascular Disease

## 2020-04-08 ENCOUNTER — Telehealth: Payer: Self-pay | Admitting: Cardiovascular Disease

## 2020-04-08 NOTE — Telephone Encounter (Signed)
Called and spoke with pt she reports she is having swelling in both her lower legs. She reports this started when she was taken off her Hyzaar and she has been off the medication for 1 week. She denies chest pain and and sob as well as any weight gain. Advised pt to watch her sodium intake as well as keeping her legs elevated and trying compressions stockings. Notified I would send this message to Dr.Kountze and our pharmacists for advice. Pt verbalized understanding with no other questions at this time.

## 2020-04-08 NOTE — Telephone Encounter (Signed)
Called and spoke with pt, reviewed [facility] advice. Pt verbalized understanding with no other questions at this time.

## 2020-04-08 NOTE — Telephone Encounter (Signed)
Patient is trying for pregnancy - cannot restart hyzaar.  Dr. Duke Salvia out of office until Thursday.  Would recommend that she contact her OB-GYN for best course of action.  Agree with monitoring sodium intake and compression stockings.

## 2020-04-08 NOTE — Telephone Encounter (Signed)
Pt c/o swelling: STAT is pt has developed SOB within 24 hours  1) How much weight have you gained and in what time span? N/A  2) If swelling, where is the swelling located? Both legs  3) Are you currently taking a fluid pill? No  4) Are you currently SOB? No  5) Do you have a log of your daily weights (if so, list)? No  6) Have you gained 3 pounds in a day or 5 pounds in a week? N/A  7) Have you traveled recently? Yes

## 2020-04-14 DIAGNOSIS — F900 Attention-deficit hyperactivity disorder, predominantly inattentive type: Secondary | ICD-10-CM | POA: Diagnosis not present

## 2020-04-14 DIAGNOSIS — F410 Panic disorder [episodic paroxysmal anxiety] without agoraphobia: Secondary | ICD-10-CM | POA: Diagnosis not present

## 2020-04-22 ENCOUNTER — Ambulatory Visit (INDEPENDENT_AMBULATORY_CARE_PROVIDER_SITE_OTHER): Payer: BC Managed Care – PPO | Admitting: Pharmacist Clinician (PhC)/ Clinical Pharmacy Specialist

## 2020-04-22 ENCOUNTER — Other Ambulatory Visit: Payer: Self-pay

## 2020-04-22 DIAGNOSIS — I10 Essential (primary) hypertension: Secondary | ICD-10-CM

## 2020-04-22 NOTE — Patient Instructions (Addendum)
  Check your blood pressure at home daily (if able) and keep record of the readings.  Take your BP meds as follows:  Take labetalol 100 mg twice daily (1/2 of the 200 mg tablets)  Continue with the nifedipine   email your home BP readings to me tonight or tomorrow.     Bring all of your meds, your BP cuff and your record of home blood pressures to your next appointment.  Exercise as you're able, try to walk approximately 30 minutes per day.  Keep salt intake to a minimum, especially watch canned and prepared boxed foods.  Eat more fresh fruits and vegetables and fewer canned items.  Avoid eating in fast food restaurants.    HOW TO TAKE YOUR BLOOD PRESSURE: . Rest 5 minutes before taking your blood pressure. .  Don't smoke or drink caffeinated beverages for at least 30 minutes before. . Take your blood pressure before (not after) you eat. . Sit comfortably with your back supported and both feet on the floor (don't cross your legs). . Elevate your arm to heart level on a table or a desk. . Use the proper sized cuff. It should fit smoothly and snugly around your bare upper arm. There should be enough room to slip a fingertip under the cuff. The bottom edge of the cuff should be 1 inch above the crease of the elbow. . Ideally, take 3 measurements at one sitting and record the average.

## 2020-04-22 NOTE — Progress Notes (Signed)
04/23/2020 Molly Clements 03-Jun-1981 [id number]   HPI:  Molly Clements is a 39 y.o. female patient of Dr Oval Linsey, with a [place] below who presents today for advanced hypertension clinic follow up.  Medical history significant for DM2 (we have no labs), PCOS and prior migraines.   At her visit with Dr. Oval Linsey last month, her pressure was 204/90.  She was referred due to elevated pressure as well as a desire to consider pregnancy.   Due to such a high pressure the losartan hct was continued for the time, and nifedipine XL 90 mg once daily.  At a follow up one month later she was switched from losartan hctz to labetalol.  Started with 100 mg bid x 1 week, then increased to 200 mg bid.  BP was much improved at that time, although still not at goal (156/78).  Today she returns for follow up.  Since her last visit she had noticed increased edema in her legs and had called to ask about switching back to losartan hctz.  It was stressed that this was not an okay alternative, due to her desire for pregnancy.  She stopped the labetalol and admits to having missed several doses of the nifedipine in the past few weeks.  She is also interested in weight loss and states she is looking into the "[facility]" here in [place].     Blood Pressure Goal:  130/80  Current Medications: losartan hctz 100/25 mg qd, nifedipine xl 90 mg qd  Family Hx: both parents living - mother has AF, CHF now 11; as do multiple siblings of mother; father died at 76, his family strong cancer hx;  3 brothers healthy - except obesity in 1-2,   Social Hx: no tobacco, alcohol, caffeine  Diet: mix of eating out/home cooked; has worked hard to cut back on salt intake since her last visit.    Exercise:  None - has membership to [employer], but has not used much; has been contacted by [employer] program to start with them   Home BP readings: home Omron cuff with blue tooth and app.  35 readings over past 18 days shows  average of 161/100 with little variation between mornings and evenings.  Range 127-181/77-120.  HR between 85-108.  Intolerances: labetalol at 200 mg bid causes edema, no issues with 100 mg bid  Labs: no recent labs, will have drawn in next 1-2 weeks  Wt Readings from Last 3 Encounters:  04/22/20 274 lb 9.6 oz (124.6 kg)  03/25/20 274 lb 12.8 oz (124.6 kg)  02/21/20 (!) 275 lb (124.7 kg)   BP Readings from Last 3 Encounters:  04/22/20 (!) 170/104  03/25/20 (!) 156/78  02/21/20 (!) 204/90   Pulse Readings from Last 3 Encounters:  04/22/20 96  03/25/20 (!) 115  02/21/20 (!) 109    Current Outpatient Medications  Medication Sig Dispense Refill  . Blood Glucose Monitoring Suppl (BLOOD GLUCOSE METER) kit Use to test blood sugar twice daily. 1 each 0  . NIFEdipine (PROCARDIA XL/NIFEDICAL-XL) 90 MG 24 hr tablet Take 1 tablet (90 mg total) by mouth daily. (Patient not taking: Reported on 04/22/2020) 90 tablet 1   No current facility-administered medications for this visit.    Allergies  Allergen Reactions  . Bee Venom Anaphylaxis    Hives swelling  . Labetalol     LEG SWELLING    Past Medical History:  Diagnosis Date  . Anxiety   . Depression   .  Hypertension   . Migraine   . PCOS (polycystic ovarian syndrome)     Blood pressure (!) 170/104, pulse 96, resp. rate 16, height 5' 5"  (1.651 m), weight 274 lb 9.6 oz (124.6 kg), last menstrual period 03/25/2020, SpO2 99 %.  Essential hypertension Patient with essential hypertension, improving, but not at goal.  She is also currently trying to get pregnant.  We had a frank discussion on the challenges to unborn fetus when her blood pressure is elevated, and why we need to be consistently compliant with medications.  She will continue with the nifedipine xl 90 mg daily and restart the labetalol at 100 mg twice daily.  She will also need to check home BP readings daily and return in one month for follow up.  Should she have problems  prior to then, she should call the office.    Shared information on [facility] [place] and she will let us know if she would like a referral to that group.   Tommy Medal PharmD CPP [facility] [address] [place], [place] [address] [phone]

## 2020-04-23 NOTE — Assessment & Plan Note (Addendum)
Patient with essential hypertension, improving, but not at goal.  She is also currently trying to get pregnant.  We had a frank discussion on the challenges to unborn fetus when her blood pressure is elevated, and why we need to be consistently compliant with medications.  She will continue with the nifedipine xl 90 mg daily and restart the labetalol at 100 mg twice daily.  She will also need to check home BP readings daily and return in one month for follow up.  Should she have problems prior to then, she should call the office.    Shared information on [facility] and she will let us know if she would like a referral to that group.

## 2020-05-01 NOTE — Progress Notes (Signed)
Call to patient. Confirmed can do 10/18 start of PREP M/W class 230p to 345p Intake assessment scheduled for 10/11 at 230pm at [facility]

## 2020-05-06 NOTE — Progress Notes (Signed)
[facility] PREP Progress Report   Patient Details  Name: Molly Clements MRN: [id number] Date of Birth: 06/05/1981 Age: 39 y.o. PCP: Pcp, No  Vitals:   05/06/20 1208  BP: 140/76  Pulse: 92  SpO2: 97%  Weight: 277 lb 3.2 oz (125.7 kg)  Height: 5\' 5"  (1.651 m)      [facility] Eval - 05/06/20 1200      Referral    Referring Provider HTN Clinic    Reason for referral Hypertension    Program Start Date 05/12/20   M/W 230p-345p x 12 wks      Measurement   Waist Circumference 52 inches    Hip Circumference 56 inches    Body fat 47.1 percent      Information for Trainer   Goals Goal wt: 185, start school, start a family, find a new job     Current Exercise none    Orthopedic Concerns none    Pertinent Medical History HTN, DM, PCOS    Current Barriers None    Restrictions/Precautions Diabetic snack before exercise    Medications that affect exercise Medication causing dizziness/drowsiness      Timed Up and Go (TUGS)   Timed Up and Go Low risk <9 seconds      Mobility and Daily Activities   I find it easy to walk up or down two or more flights of stairs. 2    I have no trouble taking out the trash. 4    I do housework such as vacuuming and dusting on my own without difficulty. 4    I can easily lift a gallon of milk (8lbs). 4    I can easily walk a mile. 2    I have no trouble reaching into high cupboards or reaching down to pick up something from the floor. 4    I do not have trouble doing out-door work such as 05/14/20, raking leaves, or gardening. 1      Mobility and Daily Activities   I feel younger than my age. 2    I feel independent. 4    I feel energetic. 2    I live an active life.  1    I feel strong. 2    I feel healthy. 1    I feel active as other people my age. 1      How fit and strong are you.   Fit and Strong Total Score 34          Past Medical History:  Diagnosis Date  . Anxiety   . Depression   . Hypertension   . Migraine     . PCOS (polycystic ovarian syndrome)    Past Surgical History:  Procedure Laterality Date  . ENDOMETRIAL BIOPSY  2008   polypectomy   Social History   Tobacco Use  Smoking Status Never Smoker  Smokeless Tobacco Never Used    Given handout on finding sodium/sugar on labels Increase water intake to 1/2 body weight in ounces (hx of constipation) Limit to eliminate dairy (constipation, insulin resistance and inflammation)  Keep added sugars below 24g/day  Sodium 1500mg  /day  2009 Daymian Lill 05/06/2020, 12:13 PM

## 2020-05-19 NOTE — Progress Notes (Signed)
[facility] PREP Weekly Session   Patient Details  Name: Molly Clements MRN: [id number] Date of Birth: [birth year] Age: 40 y.o. PCP: Pcp, No  There were no vitals filed for this visit.   [facility] Weekly seesion - 05/19/20 1600      Weekly Session   Topic Discussed Importance of resistance training;Other ways to be active    Minutes exercised this week 0 minutes    Classes attended to date 2          Fun things since last meeting: birthday party 39 year old at skating rink Grateful for: life and love Nutrition celebration: no soda Barriers/struggles: standing and being on time.   Bonnye Fava 05/19/2020, 4:22 PM

## 2020-05-20 ENCOUNTER — Ambulatory Visit (INDEPENDENT_AMBULATORY_CARE_PROVIDER_SITE_OTHER): Payer: BC Managed Care – PPO | Admitting: Pharmacist Clinician (PhC)/ Clinical Pharmacy Specialist

## 2020-05-20 ENCOUNTER — Encounter: Payer: Self-pay | Admitting: Pharmacist Clinician (PhC)/ Clinical Pharmacy Specialist

## 2020-05-20 ENCOUNTER — Other Ambulatory Visit: Payer: Self-pay

## 2020-05-20 DIAGNOSIS — I10 Essential (primary) hypertension: Secondary | ICD-10-CM | POA: Diagnosis not present

## 2020-05-20 MED ORDER — HYDRALAZINE HCL 25 MG PO TABS: 25.0000 mg | ORAL_TABLET | Freq: Two times a day (BID) | ORAL | 5 refills | Status: DC

## 2020-05-20 NOTE — Progress Notes (Signed)
-    05/20/2020 Molly Clements 01/24/1981 [id number]   HPI:  Molly Clements is a 39 y.o. female patient of Dr Oval Linsey, with a [place] below who presents today for advanced hypertension clinic follow up.  Medical history significant for DM2 (we have no labs), PCOS and prior migraines.   At her visit with Dr. Oval Linsey last month, her pressure was 204/90.  She was referred due to elevated pressure as well as a desire to consider pregnancy.   Due to such a high pressure the losartan hct was continued for the time, and nifedipine XL 90 mg once daily.  At a follow up one month later she was switched from losartan hctz to labetalol.  Started with 100 mg bid x 1 week, then increased to 200 mg bid.  She developed some lower extremity edema with the increase in labetalol dose, so stopped taking it prior to last visit.  We re-started back at 100 mg twice daily.    BP was much improved at that time, although still not at goal (156/78).  Today she returns for follow up.  Her blood pressure is much improved, though still not at goal.  Her biggest challenge is remembering to take her medications twice daily.  Admits that she forgot all doses for about 3 days last week.  Recently started working with the PREP exercise program and is loving those classes.   She is also interested in weight loss and states she is looking into the "[facility]" here in [place].     Blood Pressure Goal:  130/80  Current Medications: labetalol 100 mg bid, nifedipine xl 90 mg qd  Family Hx: both parents living - mother has AF, CHF now 42; as do multiple siblings of mother; father died at 68, his family strong cancer hx;  3 brothers healthy - except obesity in 1-2,   Social Hx: no tobacco, alcohol, caffeine  Diet: mix of eating out/home cooked; has worked hard to cut back on salt intake since her last visit.    Exercise:  None - has membership to [employer], but has not used much; has been contacted by [employer]  program to start with them   Home BP readings: home Omron cuff with blue tooth and app.  35 readings over past 18 days shows average of 161/100 with little variation between mornings and evenings.  Range 127-181/77-120.  HR between 85-108.  Intolerances: labetalol at 200 mg bid causes edema, no issues with 100 mg bid  Labs: no recent labs, will have drawn in next 1-2 weeks  Wt Readings from Last 3 Encounters:  05/20/20 276 lb 12.8 oz (125.6 kg)  05/06/20 277 lb 3.2 oz (125.7 kg)  04/22/20 274 lb 9.6 oz (124.6 kg)   BP Readings from Last 3 Encounters:  05/20/20 (!) 142/86  05/06/20 140/76  04/22/20 (!) 170/104   Pulse Readings from Last 3 Encounters:  05/06/20 92  04/22/20 96  03/25/20 (!) 115    Current Outpatient Medications  Medication Sig Dispense Refill   labetalol (NORMODYNE) 100 MG tablet Take 100 mg by mouth 2 (two) times daily.     Blood Glucose Monitoring Suppl (BLOOD GLUCOSE METER) kit Use to test blood sugar twice daily. 1 each 0   hydrALAZINE (APRESOLINE) 25 MG tablet Take 1 tablet (25 mg total) by mouth in the morning and at bedtime. 60 tablet 5   NIFEdipine (PROCARDIA XL/NIFEDICAL-XL) 90 MG 24 hr tablet Take 1 tablet (90 mg total) by  mouth daily. (Patient not taking: Reported on 04/22/2020) 90 tablet 1   No current facility-administered medications for this visit.    Allergies  Allergen Reactions   Bee Venom Anaphylaxis    Hives swelling   Labetalol     LEG SWELLING    Past Medical History:  Diagnosis Date   Anxiety    Depression    Hypertension    Migraine    PCOS (polycystic ovarian syndrome)     Blood pressure (!) 142/86, height _0  (1.651 m), weight 276 lb 12.8 oz (125.6 kg).  HTN (hypertension) Patient with hypertension, currently not at BP goal, but improving.  She still admits to problems with compliance.  Cannot increase her current medications, so will add hydralazine 25 mg bid.  Had a long discussion about compliance and  controlling BP, especially if she were to become pregnant.  She was given 2 seven day pill minders, one for AM, one for PM.  She should fill these once weekly and will hopefully help to keep her compliant with medications.  She has now been seen in CVRR three times and we will schedule her back to see Dr. Oval Linsey next month.  We can continue to see her after that.    Tommy Medal PharmD CPP [facility] [address] [place], [place] [address] [phone]

## 2020-05-20 NOTE — Assessment & Plan Note (Signed)
Patient with hypertension, currently not at BP goal, but improving.  She still admits to problems with compliance.  Cannot increase her current medications, so will add hydralazine 25 mg bid.  Had a long discussion about compliance and controlling BP, especially if she were to become pregnant.  She was given 2 seven day pill minders, one for AM, one for PM.  She should fill these once weekly and will hopefully help to keep her compliant with medications.  She has now been seen in CVRR three times and we will schedule her back to see Dr. Duke Salvia next month.  We can continue to see her after that.

## 2020-05-20 NOTE — Patient Instructions (Signed)
Return for a a follow up appointment with Dr. Duke Salvia next month.  Molly Clements will call you to schedule this appointment  Go to the lab one day in the next week for blood work - fasting  Check your blood pressure at home daily and keep record of the readings.  Take your BP meds as follows:  Start hydralazine 25 mg twice daily  Continue with your nifedipine xl and labetalol  Bring all of your meds, your BP cuff and your record of home blood pressures to your next appointment.  Exercise as youre able, try to walk approximately 30 minutes per day.  Keep salt intake to a minimum, especially watch canned and prepared boxed foods.  Eat more fresh fruits and vegetables and fewer canned items.  Avoid eating in fast food restaurants.    HOW TO TAKE YOUR BLOOD PRESSURE:  Rest 5 minutes before taking your blood pressure.   Dont smoke or drink caffeinated beverages for at least 30 minutes before.  Take your blood pressure before (not after) you eat.  Sit comfortably with your back supported and both feet on the floor (dont cross your legs).  Elevate your arm to heart level on a table or a desk.  Use the proper sized cuff. It should fit smoothly and snugly around your bare upper arm. There should be enough room to slip a fingertip under the cuff. The bottom edge of the cuff should be 1 inch above the crease of the elbow.  Ideally, take 3 measurements at one sitting and record the average.

## 2020-05-26 ENCOUNTER — Telehealth: Payer: Self-pay | Admitting: *Deleted

## 2020-05-26 NOTE — Telephone Encounter (Signed)
-----   Message from Rosalee Kaufman, RPH-CPP sent at 05/20/2020  3:16 PM EDT ----- Here's another that we've seen 3x in ADV HTN.  I'm sure we'll see her again, not at goal, having a hard time with compliance, and wants to get pregnant

## 2020-05-26 NOTE — Telephone Encounter (Signed)
Left message to call back, need ADV HTN FOLLOW UP IN ABOUT 1 MONTH

## 2020-05-29 NOTE — Telephone Encounter (Signed)
Spoke with patient and scheduled follow up 11/29

## 2020-06-09 NOTE — Progress Notes (Signed)
[facility] PREP Weekly Session   Patient Details  Name: Molly Clements MRN: [id number] Date of Birth: 24-Apr-1981 Age: 39 y.o. PCP: Pcp, No  Vitals:   06/09/20 1637  Weight: 278 lb (126.1 kg)     [facility] Weekly seesion - 06/09/20 1600      Weekly Session   Topic Discussed Eating for the season;Restaurant Eating   Sodium demo   Minutes exercised this week 60 minutes    Classes attended to date 5          Fun things since last meeting: furniture shopping Grateful for: health and peace Nutrition celebration: less sweets, working on my way up to keto Barriers/struggles: drinking more water   Bonnye Fava 06/09/2020, 4:38 PM

## 2020-06-23 ENCOUNTER — Ambulatory Visit: Payer: BC Managed Care – PPO | Admitting: Cardiovascular Disease

## 2020-06-23 NOTE — Progress Notes (Deleted)
Hypertension Clinic follow-up assessment:    Date:  06/23/2020   ID:  Molly Clements, DOB [birth year], MRN [id number]  PCP:  Pcp, No  Cardiologist:  No primary care provider on file.  Nephrologist:  Referring MD: No ref. provider found   CC: Hypertension  History of Present Illness:    Molly Clements is a 39 y.o. female with a hx of hypertension, despression and anxiety here for follow-up.  She initially established care in the hypertension clinic 01/2020. She was first diagnosed with hypertension about 10 years prior.  Her BP has not ever been well-controlled.  She saw Dr. Cherly Hensen on 11/19/19 and her BP was 160/90.  She was referred to the advanced hypertension clinic for further management and to consider the appropriate medications as she is interested in getting pregnant.  She had been on losartan/HCTZ for many years.  The initial assessment her blood pressure was 204/90.  Losartan was discontinued and she was started on nifedipine.  HCTZ was switched to labetalol.  She did not tolerate 200 mg twice daily due to edema.  It was switched to 100 twice daily.  At her last appointment she was also started on hydralazine and referred to the prep exercise program through the [facility].   Previous antihypertensives: Losartan/hctz  Past Medical History:  Diagnosis Date  . Anxiety   . Depression   . Hypertension   . Migraine   . PCOS (polycystic ovarian syndrome)     Past Surgical History:  Procedure Laterality Date  . ENDOMETRIAL BIOPSY  2008   polypectomy    Current Medications: No outpatient medications have been marked as taking for the 06/23/20 encounter (Appointment) with Chilton Si, MD.     Allergies:   Bee venom and Labetalol   Social History   Socioeconomic History  . Marital status: Married    Spouse name: n/a  . Number of children: 0  . Years of education: 13.5  . Highest education level: Not on file  Occupational History  . Occupation: customer  service  Tobacco Use  . Smoking status: Never Smoker  . Smokeless tobacco: Never Used  Substance and Sexual Activity  . Alcohol use: No  . Drug use: No  . Sexual activity: Yes    Partners: Male    Birth control/protection: Pill  Other Topics Concern  . Not on file  Social History Narrative   Lives with fiance Salley Hews   Social Determinants of Health   Financial Resource Strain:   . Difficulty of Paying Living Expenses: Not on file  Food Insecurity:   . Worried About Programme researcher, broadcasting/film/video in the Last Year: Not on file  . Ran Out of Food in the Last Year: Not on file  Transportation Needs:   . Lack of Transportation (Medical): Not on file  . Lack of Transportation (Non-Medical): Not on file  Physical Activity:   . Days of Exercise per Week: Not on file  . Minutes of Exercise per Session: Not on file  Stress:   . Feeling of Stress : Not on file  Social Connections:   . Frequency of Communication with Friends and Family: Not on file  . Frequency of Social Gatherings with Friends and Family: Not on file  . Attends Religious Services: Not on file  . Active Member of Clubs or Organizations: Not on file  . Attends Banker Meetings: Not on file  . Marital Status: Not on file     Family History:  The patient's family history includes Atrial fibrillation in her mother; Cancer in her paternal grandmother; Depression in her father; Drug abuse in her father; Heart failure in her mother; Hypertension in her maternal grandfather and mother; Obesity in her sister; Stroke in her maternal grandmother.  ROS:   Please see the history of present illness.    All other systems reviewed and are negative.  EKGs/Labs/Other Studies Reviewed:    EKG:  EKG is ordered today.  02/21/20: Sinus tachycardia.  Rate 109 bpm.  Inferolateral ST abnormality.  Cannot rule out prior anteroseptal infarct  Recent Labs: No results found for requested labs within last 8760 hours.   Recent  Lipid Panel    Component Value Date/Time   CHOL 166 03/23/2015 1028   TRIG 125 03/23/2015 1028   HDL 39 (L) 03/23/2015 1028   CHOLHDL 4.3 03/23/2015 1028   VLDL 25 03/23/2015 1028   LDLCALC 102 03/23/2015 1028    Physical Exam:    VS:  There were no vitals taken for this visit. , BMI There is no height or weight on file to calculate BMI. GENERAL:  Well appearing HEENT: Pupils equal round and reactive, fundi not visualized, oral mucosa unremarkable NECK:  No jugular venous distention, waveform within normal limits, carotid upstroke brisk and symmetric, no bruits, no thyromegaly LYMPHATICS:  No cervical adenopathy LUNGS:  Clear to auscultation bilaterally HEART:  RRR.  PMI not displaced or sustained,S1 and S2 within normal limits, no S3, no S4, no clicks, no rubs, no murmurs ABD:  Flat, positive bowel sounds normal in frequency in pitch, no bruits, no rebound, no guarding, no midline pulsatile mass, no hepatomegaly, no splenomegaly EXT:  2 plus pulses throughout, no edema, no cyanosis no clubbing SKIN:  No rashes no nodules NEURO:  Cranial nerves II through XII grossly intact, motor grossly intact throughout PSYCH:  Cognitively intact, oriented to person place and time  ASSESSMENT:    No diagnosis found.  PLAN:    # Essential hypertension:  Ms. Jacome BP remains poorly controlled.  It was elevated initially and even higher on repeat.  She is interested in being pregnant and ideally wouldn't be on losartan or HCTZ.  Her BP is quite high so I don't feel comfortable stopping medications at this time.  We will start nifedipine 90mg  daily.  Continue losartan/HCTZ for now.  Plan to switch it to labetalol at follow up.  She is interested in enrolling in the PREP exercise and nutrition program through the [facility]. Limit Aleve.  Secondary Causes of Hypertension  Medications/Herbal: OCP, steroids, stimulants, antidepressants, weight loss medication, immune suppressants, NSAIDs,  sympathomimetics, alcohol, caffeine, licorice, ginseng, St. John's wort, chemo  Sleep Apnea- (testing not indicated) Renal artery stenosis  (testing not indicated) Hyperaldosteronism (testing not indicated) Hyper/hypothyroidism Pheochromocytoma: palpitations, tachycardia, headache, diaphoresis (plasma metanephrines) Cushing's syndrome:  (check AM cortisol) Coarctation of the aorta: BP symmetric  # CV Disease Prevention:  Patient has PCOS.  Will check fasting lipids, CMP and hemoglobin A1c.   Disposition:    FU with MD/PharmD in 1 month    Medication Adjustments/Labs and Tests Ordered: Current medicines are reviewed at length with the patient today.  Concerns regarding medicines are outlined above.  No orders of the defined types were placed in this encounter.  No orders of the defined types were placed in this encounter.    Signed, [facility], MD  06/23/2020 8:01 AM    [facility]

## 2020-06-30 NOTE — Progress Notes (Signed)
[facility] PREP Weekly Session   Patient Details  Name: Molly Clements MRN: [id number] Date of Birth: [birth year] Age: 39 y.o. PCP: Pcp, No  Vitals:   06/30/20 1645  Weight: 275 lb (124.7 kg)     [facility] Weekly seesion - 06/30/20 1600      Weekly Session   Topic Discussed Other   portion control and demo   Minutes exercised this week 60 minutes    Classes attended to date 41          Fun things since last meeting: put my Christmas Trees up Grateful for: Health, mobility, and peace Nutrition celebration: more water, balanced meals Barriers/struggles: moving and strengthening   Bonnye Fava 06/30/2020, 4:46 PM

## 2020-07-07 NOTE — Progress Notes (Signed)
[facility] PREP Weekly Session   Patient Details  Name: Molly Clements MRN: [id number] Date of Birth: 1980-08-20 Age: 39 y.o. PCP: Pcp, No  Vitals:   07/07/20 1647  Weight: 275 lb (124.7 kg)     [facility] Weekly seesion - 07/07/20 1600      Weekly Session   Topic Discussed Finding support    Minutes exercised this week 30 minutes   no pain when moving   Classes attended to date 71          Fun things since last meeting: shop for my nephew bday Grateful for: small things, holidays and new opportunities Nutrition celebration: no candy Barriers/struggles: water and sweets    Bonnye Fava 07/07/2020, 4:48 PM

## 2020-07-14 NOTE — Progress Notes (Signed)
[facility] PREP Weekly Session   Patient Details  Name: Molly Clements MRN: [id number] Date of Birth: July 20, 1981 Age: 39 y.o. PCP: Pcp, No  Vitals:   07/14/20 1631  Weight: 274 lb 6.4 oz (124.5 kg)     [facility] Weekly seesion - 07/14/20 1600      Weekly Session   Topic Discussed Calorie breakdown    Classes attended to date 29          Fun things since last meeting: movie night Grateful for: another chance to do better Barriers/struggles: water and fast food   Bonnye Fava 07/14/2020, 4:32 PM

## 2020-07-22 NOTE — Progress Notes (Signed)
[facility] PREP Weekly Session   Patient Details  Name: Molly Clements MRN: [id number] Date of Birth: Apr 04, 1981 Age: 39 y.o. PCP: Pcp, No  Vitals:   07/21/20 1430  Weight: 275 lb 9.6 oz (125 kg)     [facility] Weekly seesion - 07/22/20 1200      Weekly Session   Topic Discussed Hitting roadblocks   PREP class 12/27: resetting goals   Minutes exercised this week --   not charted in min   Classes attended to date 73         PREP class 07/21/20 late entry Fun things since last meeting: being loved, giving to people I care about Grateful for: Fellowship and family Nutrition celebration: salad 3 days since last Wednesday Barriers/struggles: alcohol and sweets.   Talked about alcohol and it's effect on slowing weight loss, interfering with sleep and rest Encouraged husband to make appt with Dr Duke Salvia for high blood pressure    Bonnye Fava 07/22/2020, 12:11 PM

## 2020-08-05 ENCOUNTER — Telehealth: Payer: Self-pay

## 2020-08-05 NOTE — Telephone Encounter (Signed)
Arranged via text appt today at 1030am for final PREP measurement testing.  Arranged [facility] for today at 1030a.  Did not arrive.  Text sent to inquire-no response Left VMT requesting call back to reschedule  Call placed to [facility] transport Driver arrived but did not pick up  [place] 11[place]30[place]

## 2020-10-06 DIAGNOSIS — F410 Panic disorder [episodic paroxysmal anxiety] without agoraphobia: Secondary | ICD-10-CM | POA: Diagnosis not present

## 2020-10-06 DIAGNOSIS — F900 Attention-deficit hyperactivity disorder, predominantly inattentive type: Secondary | ICD-10-CM | POA: Diagnosis not present

## 2020-11-17 DIAGNOSIS — N898 Other specified noninflammatory disorders of vagina: Secondary | ICD-10-CM | POA: Diagnosis not present

## 2020-11-17 DIAGNOSIS — Z131 Encounter for screening for diabetes mellitus: Secondary | ICD-10-CM | POA: Diagnosis not present

## 2020-11-17 DIAGNOSIS — B373 Candidiasis of vulva and vagina: Secondary | ICD-10-CM | POA: Diagnosis not present

## 2020-11-17 DIAGNOSIS — Z01419 Encounter for gynecological examination (general) (routine) without abnormal findings: Secondary | ICD-10-CM | POA: Diagnosis not present

## 2020-11-17 DIAGNOSIS — N970 Female infertility associated with anovulation: Secondary | ICD-10-CM | POA: Diagnosis not present

## 2020-11-21 DIAGNOSIS — Z1231 Encounter for screening mammogram for malignant neoplasm of breast: Secondary | ICD-10-CM | POA: Diagnosis not present

## 2020-12-15 NOTE — Progress Notes (Incomplete)
Advanced Hypertension Clinic Follow-up:    Date:  12/15/2020   ID:  Molly Clements, DOB 1981/07/12, MRN [id number]  PCP:  Pcp, No  Cardiologist:  None  Nephrologist:  Referring MD: No ref. provider found   CC: Hypertension  History of Present Illness:    Molly Clements is a 40 y.o. female with a hx of hypertension, despression and anxiety first seen in the hypertension clinic 02/21/2020. She was first diagnosed with hypertension about 10 years ago.  Her BP has not ever been well-controlled.  She saw Dr. Cherly Hensen on 11/19/19 and her BP was 160/90.  She was referred to the advanced hypertension clinic for further management and to consider the appropriate medications as she is interested in getting pregnant.  She has been on losartan/HCTZ for many years.  She does not check her blood pressure at home.  She isn't very active at baseline but started going to the gym a few times last week for 20 minutes and felt well.  She is also trying to walk.  She gets short of breath with exertion.  She has no exertional chest pain or pressure.  She denies lower extremity edema, orthopnea, or PND.  She did the keto diet last year and was successful.  She would like to try and lose weight.  She struggles with sodium intake.  She has a prescription for Adderall but has not been taking it very much since she has not been working lately.  She snores but does not think she has apneic episodes.  She feels generally well rested in the mornings.  She rarely drinks coffee and does not drink any soda.  She rarely uses Aleve.  Today,  She denies any chest pain, shortness of breath, palpitations, or exertional symptoms. No headaches, lightheadedness, or syncope to report. Also has no lower extremity edema, orthopnea or PND.   Previous antihypertensives: Losartan/hctz  Past Medical History:  Diagnosis Date   Anxiety    Depression    Hypertension    Migraine    PCOS (polycystic ovarian syndrome)      Past Surgical History:  Procedure Laterality Date   ENDOMETRIAL BIOPSY  2008   polypectomy    Current Medications: No outpatient medications have been marked as taking for the 12/16/20 encounter (Appointment) with Chilton Si, MD.     Allergies:   Bee venom and Labetalol   Social History   Socioeconomic History   Marital status: Married    Spouse name: n/a   Number of children: 0   Years of education: 13.5   Highest education level: Not on file  Occupational History   Occupation: customer service  Tobacco Use   Smoking status: Never Smoker   Smokeless tobacco: Never Used  Substance and Sexual Activity   Alcohol use: No   Drug use: No   Sexual activity: Yes    Partners: Male    Birth control/protection: Pill  Other Topics Concern   Not on file  Social History Narrative   Lives with fiance Salley Hews   Social Determinants of Health   Financial Resource Strain: Not on file  Food Insecurity: Not on file  Transportation Needs: Not on file  Physical Activity: Not on file  Stress: Not on file  Social Connections: Not on file     Family History: The patient's family history includes Atrial fibrillation in her mother; Cancer in her paternal grandmother; Depression in her father; Drug abuse in her father; Heart failure in her mother;  Hypertension in her maternal grandfather and mother; Obesity in her sister; Stroke in her maternal grandmother.  ROS:   Please see the history of present illness.    (+) All other systems reviewed and are negative.  EKGs/Labs/Other Studies Reviewed:    EKG:    12/15/2020: *** 02/21/20: Sinus tachycardia.  Rate 109 bpm.  Inferolateral ST abnormality.  Cannot rule out prior anteroseptal infarct  Recent Labs: No results found for requested labs within last 8760 hours.   Recent Lipid Panel    Component Value Date/Time   CHOL 166 03/23/2015 1028   TRIG 125 03/23/2015 1028   HDL 39 (L) 03/23/2015 1028    CHOLHDL 4.3 03/23/2015 1028   VLDL 25 03/23/2015 1028   LDLCALC 102 03/23/2015 1028    Physical Exam:    VS:  There were no vitals taken for this visit. , BMI There is no height or weight on file to calculate BMI. GENERAL:  Well appearing HEENT: Pupils equal round and reactive, fundi not visualized, oral mucosa unremarkable NECK:  No jugular venous distention, waveform within normal limits, carotid upstroke brisk and symmetric, no bruits, no thyromegaly LYMPHATICS:  No cervical adenopathy LUNGS:  Clear to auscultation bilaterally HEART:  RRR.  PMI not displaced or sustained,S1 and S2 within normal limits, no S3, no S4, no clicks, no rubs, no murmurs ABD:  Flat, positive bowel sounds normal in frequency in pitch, no bruits, no rebound, no guarding, no midline pulsatile mass, no hepatomegaly, no splenomegaly EXT:  2 plus pulses throughout, no edema, no cyanosis no clubbing SKIN:  No rashes no nodules NEURO:  Cranial nerves II through XII grossly intact, motor grossly intact throughout PSYCH:  Cognitively intact, oriented to person place and time  ASSESSMENT:    No diagnosis found.  PLAN:   No problem-specific Assessment & Plan notes found for this encounter.   # Essential hypertension:  Ms. Human BP remains poorly controlled.  It was elevated initially and even higher on repeat.  She is interested in being pregnant and ideally wouldn't be on losartan or HCTZ.  Her BP is quite high so I don't feel comfortable stopping medications at this time.  We will start nifedipine 90mg  daily.  Continue losartan/HCTZ for now.  Plan to switch it to labetalol at follow up.  She is interested in enrolling in the PREP exercise and nutrition program through the [facility]. Limit Aleve.  Secondary Causes of Hypertension  Medications/Herbal: OCP, steroids, stimulants, antidepressants, weight loss medication, immune suppressants, NSAIDs, sympathomimetics, alcohol, caffeine, licorice, ginseng, [place]  wort, chemo  Sleep Apnea- (testing not indicated) Renal artery stenosis  (testing not indicated) Hyperaldosteronism (testing not indicated) Hyper/hypothyroidism Pheochromocytoma: palpitations, tachycardia, headache, diaphoresis (plasma metanephrines) Cushing's syndrome:  (check AM cortisol) Coarctation of the aorta: BP symmetric  # CV Disease Prevention:  Patient has PCOS.  Will check fasting lipids, CMP and hemoglobin A1c.   Disposition:    FU with MD/PharmD in *** month.    Medication Adjustments/Labs and Tests Ordered: Current medicines are reviewed at length with the patient today.  Concerns regarding medicines are outlined above.  No orders of the defined types were placed in this encounter.  No orders of the defined types were placed in this encounter.    [facility]  12/15/2020 9:04 AM    [facility]

## 2020-12-16 ENCOUNTER — Ambulatory Visit: Payer: BC Managed Care – PPO | Admitting: Cardiovascular Disease

## 2020-12-29 DIAGNOSIS — E1169 Type 2 diabetes mellitus with other specified complication: Secondary | ICD-10-CM | POA: Diagnosis not present

## 2020-12-29 DIAGNOSIS — I10 Essential (primary) hypertension: Secondary | ICD-10-CM | POA: Diagnosis not present

## 2020-12-29 DIAGNOSIS — E282 Polycystic ovarian syndrome: Secondary | ICD-10-CM | POA: Diagnosis not present

## 2020-12-29 DIAGNOSIS — E1165 Type 2 diabetes mellitus with hyperglycemia: Secondary | ICD-10-CM | POA: Diagnosis not present

## 2021-01-15 DIAGNOSIS — E1165 Type 2 diabetes mellitus with hyperglycemia: Secondary | ICD-10-CM | POA: Diagnosis not present

## 2021-01-15 DIAGNOSIS — I10 Essential (primary) hypertension: Secondary | ICD-10-CM | POA: Diagnosis not present

## 2021-03-06 ENCOUNTER — Other Ambulatory Visit: Payer: Self-pay | Admitting: Cardiovascular Disease

## 2021-03-06 DIAGNOSIS — I10 Essential (primary) hypertension: Secondary | ICD-10-CM

## 2021-03-24 ENCOUNTER — Ambulatory Visit (HOSPITAL_BASED_OUTPATIENT_CLINIC_OR_DEPARTMENT_OTHER): Payer: BC Managed Care – PPO | Admitting: Cardiovascular Disease

## 2021-03-24 ENCOUNTER — Other Ambulatory Visit: Payer: Self-pay

## 2021-03-24 ENCOUNTER — Encounter (HOSPITAL_BASED_OUTPATIENT_CLINIC_OR_DEPARTMENT_OTHER): Payer: Self-pay | Admitting: Cardiovascular Disease

## 2021-03-24 VITALS — BP 192/102 | HR 116 | Ht 64.5 in | Wt 275.2 lb

## 2021-03-24 DIAGNOSIS — Z5181 Encounter for therapeutic drug level monitoring: Secondary | ICD-10-CM | POA: Diagnosis not present

## 2021-03-24 DIAGNOSIS — R000 Tachycardia, unspecified: Secondary | ICD-10-CM

## 2021-03-24 DIAGNOSIS — I10 Essential (primary) hypertension: Secondary | ICD-10-CM | POA: Diagnosis not present

## 2021-03-24 HISTORY — DX: Tachycardia, unspecified: R00.0

## 2021-03-24 MED ORDER — VALSARTAN-HYDROCHLOROTHIAZIDE 320-25 MG PO TABS: 1.0000 | ORAL_TABLET | Freq: Every day | ORAL | 3 refills | Status: DC

## 2021-03-24 NOTE — Progress Notes (Signed)
Advanced Hypertension Clinic Follow-Up:    Date:  03/24/2021   ID:  Molly Clements, DOB [birth year], MRN [id number]  PCP:  Fleet Contras, MD  Cardiologist:  None  Nephrologist:  Referring MD: No ref. provider found   CC: Hypertension  History of Present Illness:    Molly Clements is a 40 y.o. female with a hx of hypertension, despression and anxiety here for follow-up. She initially established care in the hypertension clinic 02/21/2020. She was first diagnosed with hypertension about 10 years ago.  Her BP has never been well-controlled.  She saw Dr. Cherly Hensen on 11/19/19 and her BP was 160/90.  She was referred to the advanced hypertension clinic for further management and to consider the appropriate medications as she is interested in getting pregnant.  She has been on losartan/HCTZ for many years.    She was first seen in advanced hypertension clinic 01/2020. She was started on nifedipine and referred to PREP. She followed up with our pharmacist and her blood pressure was poorly controlled so labetalol and hydralazine were added. Cortisol was ordered but the labs were not obtained.   Today, she is feeling good overall. She has self-discontinued labetalol and nifedipine. The last time she took antihypertensives was about 2 weeks ago, and her blood pressure was around 136/67 at that time. Since stopping her medication she has had some bilateral LE edema. For her diet, she is trying to eat low-carb foods and avoid sugary drinks. They are also using alternative seasonings. She has not participated in formal exercise, but is active with chores around the house. Currently she does not feel her heart racing. Last month she was infected with COVID. She denies any palpitations, chest pain, or shortness of breath. No lightheadedness, headaches, syncope, orthopnea, or PND. Also has no exertional symptoms.  Previous antihypertensives: Losartan/hctz Labetalol- ran out Nifedipine- ran  out Hydralazine- ran out  Past Medical History:  Diagnosis Date   Anxiety    Depression    Hypertension    Migraine    PCOS (polycystic ovarian syndrome)    Tachycardia 03/24/2021    Past Surgical History:  Procedure Laterality Date   ENDOMETRIAL BIOPSY  2008   polypectomy    Current Medications: Current Meds  Medication Sig   valsartan-hydrochlorothiazide (DIOVAN HCT) 320-25 MG tablet Take 1 tablet by mouth daily.     Allergies:   Bee venom and Labetalol   Social History   Socioeconomic History   Marital status: Married    Spouse name: n/a   Number of children: 0   Years of education: 13.5   Highest education level: Not on file  Occupational History   Occupation: customer service  Tobacco Use   Smoking status: Never   Smokeless tobacco: Never  Substance and Sexual Activity   Alcohol use: No   Drug use: No   Sexual activity: Yes    Partners: Male    Birth control/protection: Pill  Other Topics Concern   Not on file  Social History Narrative   Lives with fiance Salley Hews   Social Determinants of Health   Financial Resource Strain: Low Risk    Difficulty of Paying Living Expenses: Not very hard  Food Insecurity: Food Insecurity Present   Worried About Running Out of Food in the Last Year: Sometimes true   Ran Out of Food in the Last Year: Sometimes true  Transportation Needs: Unmet Transportation Needs   Lack of Transportation (Medical): Yes   Lack of Transportation (Non-Medical): Yes  Physical Activity: Inactive   Days of Exercise per Week: 0 days   Minutes of Exercise per Session: 0 min  Stress: Stress Concern Present   Feeling of Stress : To some extent  Social Connections: Not on file     Family History: The patient's family history includes Atrial fibrillation in her mother; Cancer in her paternal grandmother; Depression in her father; Drug abuse in her father; Heart failure in her mother; Hypertension in her maternal grandfather and  mother; Obesity in her sister; Stroke in her maternal grandmother.  ROS:   Please see the history of present illness.    (+) Bilateral LE edema All other systems reviewed and are negative.  EKGs/Labs/Other Studies Reviewed:    No prior CV studies available.  EKG:  03/24/2021: Sinus tachycardia. Rate 116 bpm. Cannot rule out prior anteroseptal infarct. 02/21/20: Sinus tachycardia.  Rate 109 bpm.  Inferolateral ST abnormality.  Cannot rule out prior anteroseptal infarct  Recent Labs: No results found for requested labs within last 8760 hours.   Recent Lipid Panel    Component Value Date/Time   CHOL 166 03/23/2015 1028   TRIG 125 03/23/2015 1028   HDL 39 (L) 03/23/2015 1028   CHOLHDL 4.3 03/23/2015 1028   VLDL 25 03/23/2015 1028   LDLCALC 102 03/23/2015 1028    Physical Exam:    VS:  BP (!) 192/102 (BP Location: Right Arm, Patient Position: Sitting)   Pulse (!) 116   Ht 5' 4.5" (1.638 m)   Wt 275 lb 3.2 oz (124.8 kg)   BMI 46.51 kg/m  , BMI Body mass index is 46.51 kg/m. GENERAL:  Well appearing HEENT: Pupils equal round and reactive, fundi not visualized, oral mucosa unremarkable NECK:  No jugular venous distention, waveform within normal limits, carotid upstroke brisk and symmetric, no bruits, no thyromegaly LYMPHATICS:  No cervical adenopathy LUNGS:  Clear to auscultation bilaterally HEART:  RRR.  PMI not displaced or sustained,S1 and S2 within normal limits, no S3, no S4, no clicks, no rubs, no murmurs ABD:  Flat, positive bowel sounds normal in frequency in pitch, no bruits, no rebound, no guarding, no midline pulsatile mass, no hepatomegaly, no splenomegaly EXT:  2 plus pulses throughout, trace edema, no cyanosis no clubbing SKIN:  No rashes no nodules NEURO:  Cranial nerves II through XII grossly intact, motor grossly intact throughout PSYCH:  Cognitively intact, oriented to person place and time  ASSESSMENT:    1. Essential hypertension   2. Tachycardia   3.  Therapeutic drug monitoring   4. Primary hypertension   5. Morbid obesity (HCC)      PLAN:   HTN (hypertension) BP is very poorly poorly controlled off all her home medications.  She stopped all meds prescribed by our clinic and ran out of her losartan/HCTZ.  BP was in the 130s on this regimen.  We will start her on valsartan/HCTZ 320/25mg  daily.  Check BMP in a week.  She will track her BP and work to limit sodium.  Increase exercise to 150 minutes weekly.  She completed the PREP program. Patient was provided information on transportation assistance for future visits.  She will come back to have cortisol and TSH checked.  Morbid obesity (HCC) Offered PREP but she has already participated and not interested at this time.  She will keep working on diet and exercise.  Consider Ozempic.  Tachycardia Today she was tachycardic to 116.  She is asymptomatic.  We will check a CBC, CMP, magnesium, and  TSH.   Disposition:    FU with MD/PharmD in 1 month.    Medication Adjustments/Labs and Tests Ordered: Current medicines are reviewed at length with the patient today.  Concerns regarding medicines are outlined above.  Orders Placed This Encounter  Procedures   TSH   CBC with Differential/Platelet   Lipid panel   Comprehensive metabolic panel   Magnesium   Cortisol   EKG 12-Lead    Meds ordered this encounter  Medications   valsartan-hydrochlorothiazide (DIOVAN HCT) 320-25 MG tablet    Sig: Take 1 tablet by mouth daily.    Dispense:  90 tablet    Refill:  3    I,Mathew Stumpf,acting as a scribe for Chilton Si, MD.,have documented all relevant documentation on the behalf of Chilton Si, MD,as directed by  Chilton Si, MD while in the presence of Chilton Si, MD.  I, Maalik Pinn C. Duke Salvia, MD have reviewed all documentation for this visit.  The documentation of the exam, diagnosis, procedures, and orders on 03/24/2021 are all accurate and  complete.   Signed, Chilton Si, MD  03/24/2021 4:13 PM    [facility]

## 2021-03-24 NOTE — Assessment & Plan Note (Addendum)
BP is very poorly poorly controlled off all her home medications.  She stopped all meds prescribed by our clinic and ran out of her losartan/HCTZ.  BP was in the 130s on this regimen.  We will start her on valsartan/HCTZ 320/25mg  daily.  Check BMP in a week.  She will track her BP and work to limit sodium.  Increase exercise to 150 minutes weekly.  She completed the PREP program. Patient was provided information on transportation assistance for future visits.  She will come back to have cortisol and TSH checked.

## 2021-03-24 NOTE — Assessment & Plan Note (Signed)
Today she was tachycardic to 116.  She is asymptomatic.  We will check a CBC, CMP, magnesium, and TSH.

## 2021-03-24 NOTE — Patient Instructions (Addendum)
Medication Instructions:  START VALSARTAN HCT 320-25 MG DAILY   Labwork: FASTING LP/CMET/CBC/MAGNESIUM/CORTISOL/TSH  SOON   Testing/Procedures: NONE   Follow-Up: 04/27/2021  2:00 PM WITH PHARM D AT [facility]  LOCATION

## 2021-03-24 NOTE — Assessment & Plan Note (Signed)
Offered PREP but she has already participated and not interested at this time.  She will keep working on diet and exercise.  Consider Ozempic.

## 2021-03-25 DIAGNOSIS — F900 Attention-deficit hyperactivity disorder, predominantly inattentive type: Secondary | ICD-10-CM | POA: Diagnosis not present

## 2021-03-25 DIAGNOSIS — F410 Panic disorder [episodic paroxysmal anxiety] without agoraphobia: Secondary | ICD-10-CM | POA: Diagnosis not present

## 2021-03-26 IMAGING — DX DG CHEST 1V PORT
1 series · 1 of 1 positions shown · non-contrast
Comparison: None.

CLINICAL DATA: Shortness of breath. COVID positive.

EXAM:
PORTABLE CHEST 1 VIEW

[chest ap]
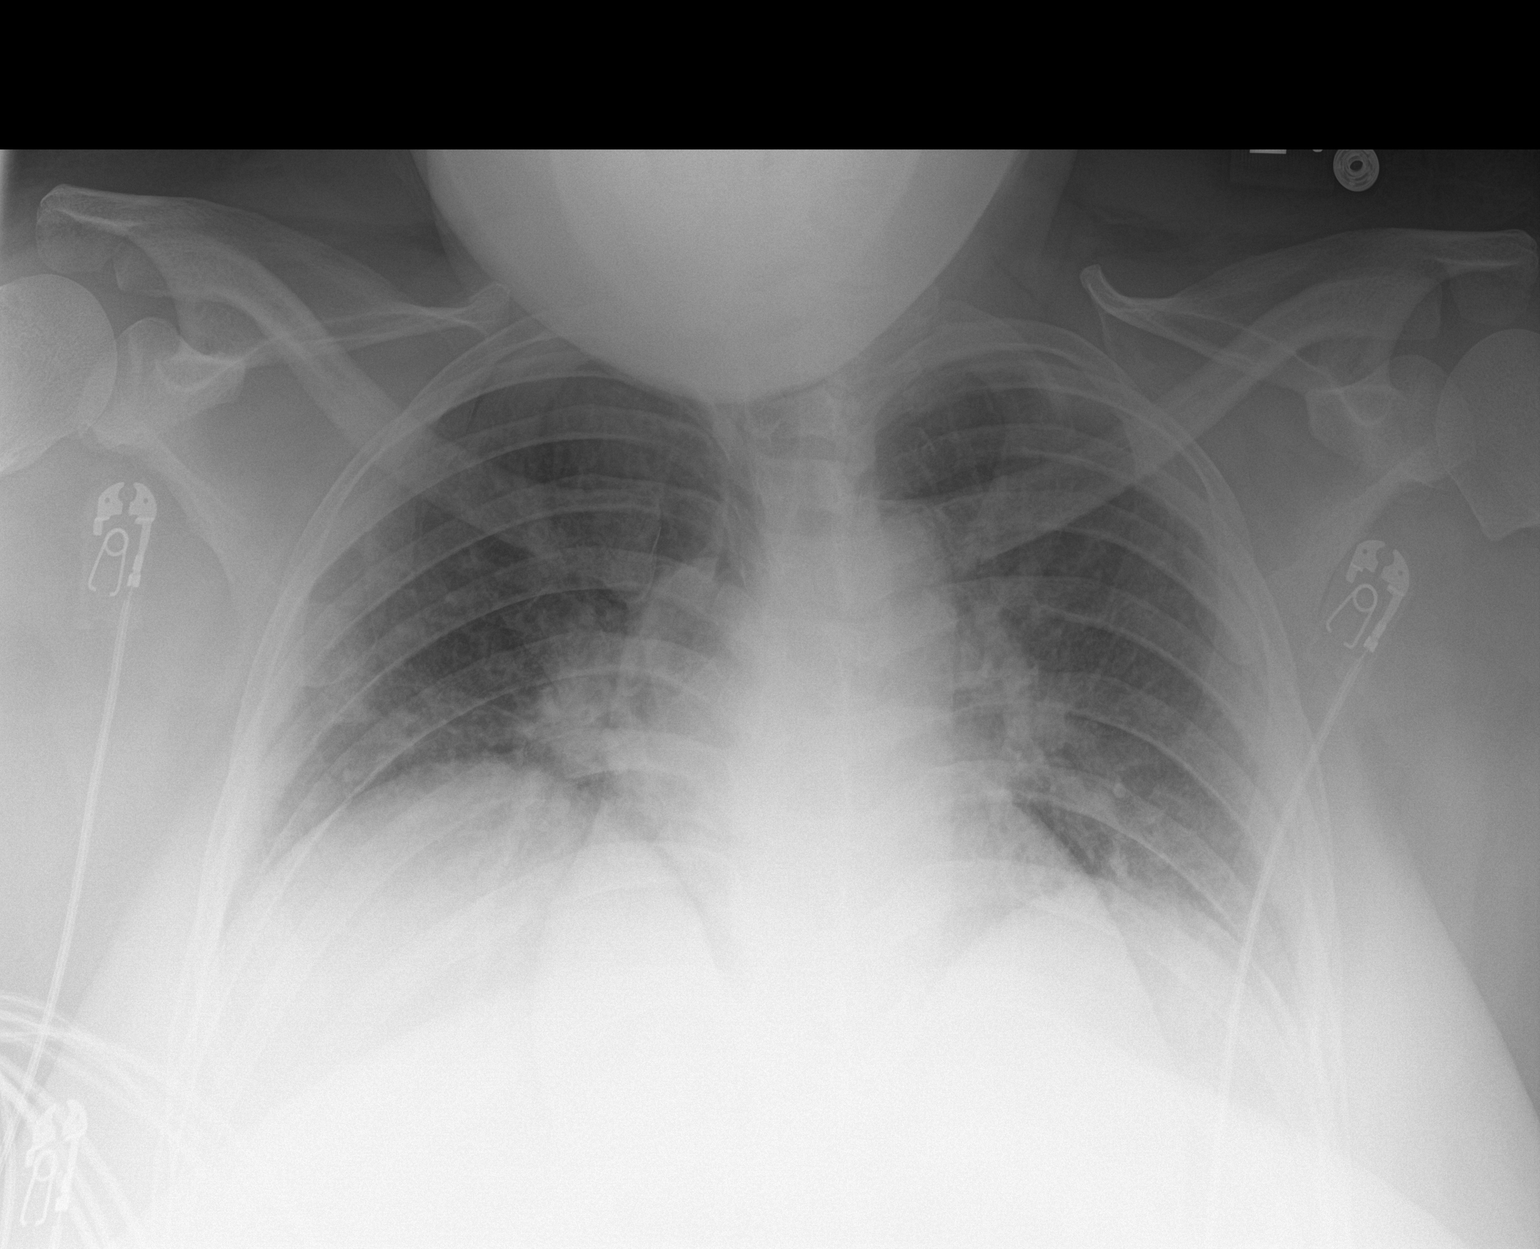

[1 of 1 positions shown; findings below may reference images not displayed]

FINDINGS: Very low lung volumes and soft tissue attenuation from habitus limit
assessment. Vague heterogeneous opacities in the lower lung zones.
Heart size grossly normal for technique. No pneumothorax or large
pleural effusion.
IMPRESSION: Hypoventilatory chest. Vague heterogeneous bibasilar opacities most
consistent with COVID pneumonia.

## 2021-04-03 DIAGNOSIS — Z5181 Encounter for therapeutic drug level monitoring: Secondary | ICD-10-CM | POA: Diagnosis not present

## 2021-04-03 DIAGNOSIS — I10 Essential (primary) hypertension: Secondary | ICD-10-CM | POA: Diagnosis not present

## 2021-04-03 DIAGNOSIS — R000 Tachycardia, unspecified: Secondary | ICD-10-CM | POA: Diagnosis not present

## 2021-04-04 LAB — CBC WITH DIFFERENTIAL/PLATELET
Basophils Absolute: 0.1 10*3/uL (ref 0.0–0.2)
Basos: 1 %
EOS (ABSOLUTE): 0.3 10*3/uL (ref 0.0–0.4)
Eos: 4 %
Hematocrit: 43.7 % (ref 34.0–46.6)
Hemoglobin: 13.8 g/dL (ref 11.1–15.9)
Immature Grans (Abs): 0 10*3/uL (ref 0.0–0.1)
Immature Granulocytes: 0 %
Lymphocytes Absolute: 2.9 10*3/uL (ref 0.7–3.1)
Lymphs: 40 %
MCH: 25.9 pg — ABNORMAL LOW (ref 26.6–33.0)
MCHC: 31.6 g/dL (ref 31.5–35.7)
MCV: 82 fL (ref 79–97)
Monocytes Absolute: 0.7 10*3/uL (ref 0.1–0.9)
Monocytes: 9 %
Neutrophils Absolute: 3.3 10*3/uL (ref 1.4–7.0)
Neutrophils: 46 %
Platelets: 262 10*3/uL (ref 150–450)
RBC: 5.33 x10E6/uL — ABNORMAL HIGH (ref 3.77–5.28)
RDW: 12.5 % (ref 11.7–15.4)
WBC: 7.2 10*3/uL (ref 3.4–10.8)

## 2021-04-04 LAB — COMPREHENSIVE METABOLIC PANEL
ALT: 20 IU/L (ref 0–32)
AST: 26 IU/L (ref 0–40)
Albumin/Globulin Ratio: 1.4 (ref 1.2–2.2)
Albumin: 4.2 g/dL (ref 3.8–4.8)
Alkaline Phosphatase: 63 IU/L (ref 44–121)
BUN/Creatinine Ratio: 16 (ref 9–23)
BUN: 12 mg/dL (ref 6–24)
Bilirubin Total: 0.6 mg/dL (ref 0.0–1.2)
CO2: 23 mmol/L (ref 20–29)
Calcium: 9.4 mg/dL (ref 8.7–10.2)
Chloride: 96 mmol/L (ref 96–106)
Creatinine, Ser: 0.74 mg/dL (ref 0.57–1.00)
Globulin, Total: 2.9 g/dL (ref 1.5–4.5)
Glucose: 281 mg/dL — ABNORMAL HIGH (ref 65–99)
Potassium: 4.7 mmol/L (ref 3.5–5.2)
Sodium: 135 mmol/L (ref 134–144)
Total Protein: 7.1 g/dL (ref 6.0–8.5)
eGFR: 104 mL/min/{1.73_m2} (ref >59)

## 2021-04-04 LAB — LIPID PANEL
Chol/HDL Ratio: 4.3 ratio (ref 0.0–4.4)
Cholesterol, Total: 151 mg/dL (ref 100–199)
HDL: 35 mg/dL — ABNORMAL LOW (ref >39)
LDL Chol Calc (NIH): 87 mg/dL (ref 0–99)
Triglycerides: 167 mg/dL — ABNORMAL HIGH (ref 0–149)
VLDL Cholesterol Cal: 29 mg/dL (ref 5–40)

## 2021-04-04 LAB — TSH: TSH: 0.861 u[IU]/mL (ref 0.450–4.500)

## 2021-04-04 LAB — MAGNESIUM: Magnesium: 1.6 mg/dL (ref 1.6–2.3)

## 2021-04-04 LAB — CORTISOL: Cortisol: 11.5 ug/dL

## 2021-04-27 ENCOUNTER — Other Ambulatory Visit: Payer: Self-pay

## 2021-04-27 ENCOUNTER — Ambulatory Visit: Payer: BC Managed Care – PPO | Admitting: Pharmacist Clinician (PhC)/ Clinical Pharmacy Specialist

## 2021-04-27 DIAGNOSIS — I10 Essential (primary) hypertension: Secondary | ICD-10-CM | POA: Diagnosis not present

## 2021-04-27 MED ORDER — AMLODIPINE BESYLATE 5 MG PO TABS: 5.0000 mg | ORAL_TABLET | Freq: Every day | ORAL | 6 refills | Status: DC

## 2021-04-27 NOTE — Patient Instructions (Signed)
Return for a a follow up appointment November 1 at 11 am  Check your blood pressure at home daily and keep record of the readings.  Take your BP meds as follows:  Start amlodipine 5 mg once daily  Continue with valsartan/hctz once daily  Bring all of your meds, your BP cuff and your record of home blood pressures to your next appointment.  Exercise as you're able, try to walk approximately 30 minutes per day.  Keep salt intake to a minimum, especially watch canned and prepared boxed foods.  Eat more fresh fruits and vegetables and fewer canned items.  Avoid eating in fast food restaurants.    HOW TO TAKE YOUR BLOOD PRESSURE: Rest 5 minutes before taking your blood pressure.  Don't smoke or drink caffeinated beverages for at least 30 minutes before. Take your blood pressure before (not after) you eat. Sit comfortably with your back supported and both feet on the floor (don't cross your legs). Elevate your arm to heart level on a table or a desk. Use the proper sized cuff. It should fit smoothly and snugly around your bare upper arm. There should be enough room to slip a fingertip under the cuff. The bottom edge of the cuff should be 1 inch above the crease of the elbow. Ideally, take 3 measurements at one sitting and record the average.

## 2021-04-27 NOTE — Progress Notes (Signed)
-    04/29/2021 Oral L Stuard 06-11-1981 [id number]   HPI:  Molly Clements is a 40 y.o. female patient of Dr Duke Salvia, with a PMH below who presents today for advanced hypertension clinic follow up.  Medical history significant for DM2 (we have no labs), PCOS and prior migraines.   We worked with her in 2021, and at her last visit (October) was on labetalol and nifedipine.  She saw Dr. Duke Salvia in August of this year and her pressure was back up to 192/102.  She admitted that she had stopped both nifedipine and labetalol, as her pressure was doing well (136/67 by patient recall).  At that visit she had been without medications for about 2 weeks.  She noted that she was not currently looking to become pregnant, so valsartan hctz 320/25 was started.    Today she returns for a follow up visit.  In the past year she and her husband have moved into their own home (were previously living with her parents), but have decided not to try for a pregnancy at this time.  States compliance with valsartan/hctz  182/109 on home cuff  Blood Pressure Goal:  130/80  Current Medications: valsartan hctz 320/25  Family Hx: both parents living - mother has AF, CHF now 19; as do multiple siblings of mother; father died at 42, his family strong cancer hx;  3 brothers healthy - except obesity in 1-2,   Social Hx: no tobacco, alcohol, or caffeine  Diet: mix of eating out/home cooked; tries to avoid salt and processed foods  Exercise:  None - has membership to [employer], but has not used much; completed PREP exercise program     Home BP readings: home cuff reads about 20 points high systolic today in the office, home readings still not near goal BP  Intolerances: labetalol at 200 mg bid causes edema, no issues with 100 mg bid  Labs: 9/22:  Na 135, K 4.7, Glu 281, BUN 12, SCr 0.74 GFR 104  Wt Readings from Last 3 Encounters:  04/27/21 277 lb (125.6 kg)  03/24/21 275 lb 3.2 oz (124.8 kg)   07/21/20 275 lb 9.6 oz (125 kg)   BP Readings from Last 3 Encounters:  04/27/21 (!) 162/108  03/24/21 (!) 192/102  05/20/20 (!) 142/86   Pulse Readings from Last 3 Encounters:  04/27/21 98  03/24/21 (!) 116  05/06/20 92    Current Outpatient Medications  Medication Sig Dispense Refill   ADDERALL XR 30 MG 24 hr capsule Take 30 mg by mouth daily as needed.     ALPRAZolam (XANAX) 0.5 MG tablet Take 0.5-1 mg by mouth 3 (three) times daily as needed.     amLODipine (NORVASC) 5 MG tablet Take 1 tablet (5 mg total) by mouth daily. 30 tablet 6   fluconazole (DIFLUCAN) 150 MG tablet Take 150 mg by mouth once.     nystatin-triamcinolone (MYCOLOG II) cream Apply 1 application topically as needed.     valsartan-hydrochlorothiazide (DIOVAN HCT) 320-25 MG tablet Take 1 tablet by mouth daily. 90 tablet 3   No current facility-administered medications for this visit.    Allergies  Allergen Reactions   Bee Venom Anaphylaxis    Hives swelling   Labetalol     LEG SWELLING    Past Medical History:  Diagnosis Date   Anxiety    Depression    Hypertension    Migraine    PCOS (polycystic ovarian syndrome)    Tachycardia 03/24/2021  Blood pressure (!) 162/108, pulse 98, resp. rate 16, height 5\' 5"  (1.651 m), weight 277 lb (125.6 kg), last menstrual period 04/17/2021, SpO2 99 %.  HTN (hypertension) Patient with essential hypertension, still not at BP goal.  Will add amlodipine 5 mg daily to her current regimen.  She should continue with home BP monitoring and bring those readings, along with her cuff, to next appointment.  We will see her back in 1 month for follow up.    04/19/2021 PharmD CPP [facility] [address] [place], [place] [phone]

## 2021-04-29 ENCOUNTER — Encounter: Payer: Self-pay | Admitting: Pharmacist Clinician (PhC)/ Clinical Pharmacy Specialist

## 2021-04-29 NOTE — Assessment & Plan Note (Signed)
Patient with essential hypertension, still not at BP goal.  Will add amlodipine 5 mg daily to her current regimen.  She should continue with home BP monitoring and bring those readings, along with her cuff, to next appointment.  We will see her back in 1 month for follow up.

## 2021-05-26 ENCOUNTER — Ambulatory Visit: Payer: BC Managed Care – PPO

## 2021-06-23 ENCOUNTER — Telehealth: Payer: BC Managed Care – PPO | Admitting: Family

## 2021-06-23 DIAGNOSIS — R6889 Other general symptoms and signs: Secondary | ICD-10-CM

## 2021-06-24 MED ORDER — FLUTICASONE PROPIONATE 50 MCG/ACT NA SUSP: 2.0000 | Freq: Every day | NASAL | 6 refills | Status: DC

## 2021-06-24 MED ORDER — BENZONATATE 100 MG PO CAPS: 100.0000 mg | ORAL_CAPSULE | Freq: Three times a day (TID) | ORAL | 0 refills | Status: DC | PRN

## 2021-06-24 MED ORDER — PREDNISONE 10 MG (21) PO TBPK: ORAL_TABLET | ORAL | 0 refills | Status: DC

## 2021-06-24 MED ORDER — NAPROXEN 500 MG PO TABS: 500.0000 mg | ORAL_TABLET | Freq: Two times a day (BID) | ORAL | 0 refills | Status: DC

## 2021-06-24 NOTE — Addendum Note (Signed)
Addended by: Jannifer Rodney A on: 06/24/2021 06:04 PM   Modules accepted: Orders

## 2021-06-24 NOTE — Progress Notes (Signed)
E visit for Flu like symptoms   We are sorry that you are not feeling well.  Here is how we plan to help! Based on what you have shared with me it looks like you may have a respiratory virus that may be influenza.  Influenza or "the flu" is   an infection caused by a respiratory virus. The flu virus is highly contagious and persons who did not receive their yearly flu vaccination may "catch" the flu from close contact.  We have anti-viral medications to treat the viruses that cause this infection. They are not a "cure" and only shorten the course of the infection. These prescriptions are most effective when they are given within the first 2 days of "flu" symptoms. Antiviral medication are indicated if you have a high risk of complications from the flu. You should  also consider an antiviral medication if you are in close contact with someone who is at risk. These medications can help patients avoid complications from the flu  but have side effects that you should know. Possible side effects from Tamiflu or oseltamivir include nausea, vomiting, diarrhea, dizziness, headaches, eye redness, sleep problems or other respiratory symptoms. You should not take Tamiflu if you have an allergy to oseltamivir or any to the ingredients in Tamiflu.  Based upon your symptoms and potential risk factors I recommend that you follow the flu symptoms recommendation that I have listed below.  I have sent in naprosyn 500 mg you can take twice a day for pain and aches. While you take this no other ibuprofen or motrin. You can still take tylenol. I have also sent in flonase and tessalon.   ANYONE WHO HAS FLU SYMPTOMS SHOULD: Stay home. The flu is highly contagious and going out or to work exposes others! Be sure to drink plenty of fluids. Water is fine as well as fruit juices, sodas and electrolyte beverages. You may want to stay away from caffeine or alcohol. If you are nauseated, try taking small sips of liquids. How do  you know if you are getting enough fluid? Your urine should be a pale yellow or almost colorless. Get rest. Taking a steamy shower or using a humidifier may help nasal congestion and ease sore throat pain. Using a saline nasal spray works much the same way. Cough drops, hard candies and sore throat lozenges may ease your cough. Line up a caregiver. Have someone check on you regularly.   GET HELP RIGHT AWAY IF: You cannot keep down liquids or your medications. You become short of breath Your fell like you are going to pass out or loose consciousness. Your symptoms persist after you have completed your treatment plan MAKE SURE YOU  Understand these instructions. Will watch your condition. Will get help right away if you are not doing well or get worse.  Your e-visit answers were reviewed by a board certified advanced clinical practitioner to complete your personal care plan.  Depending on the condition, your plan could have included both over the counter or prescription medications.  If there is a problem please reply  once you have received a response from your provider.  Your safety is important to [place].  If you have drug allergies check your prescription carefully.    You can use MyChart to ask questions about today's visit, request a non-urgent call back, or ask for a work or school excuse for 24 hours related to this e-Visit. If it has been greater than 24 hours you will need  to follow up with your provider, or enter a new e-Visit to address those concerns.  You will get an e-mail in the next two days asking about your experience.  I hope that your e-visit has been valuable and will speed your recovery. Thank you for using e-visits.  Approximately 5 minutes was spent documenting and reviewing patient's chart.

## 2021-07-07 ENCOUNTER — Telehealth: Payer: BC Managed Care – PPO | Admitting: Nurse Practitioner

## 2021-07-07 DIAGNOSIS — B379 Candidiasis, unspecified: Secondary | ICD-10-CM

## 2021-07-07 MED ORDER — FLUCONAZOLE 150 MG PO TABS: 150.0000 mg | ORAL_TABLET | Freq: Once | ORAL | 0 refills | Status: AC

## 2021-07-07 NOTE — Progress Notes (Signed)
E-Visit for Vaginal Symptoms  We are sorry that you are not feeling well. Here is how we plan to help! Based on what you shared with me it looks like you: May have a yeast vaginosis  Vaginosis is an inflammation of the vagina that can result in discharge, itching and pain. The cause is usually a change in the normal balance of vaginal bacteria or an infection. Vaginosis can also result from reduced estrogen levels after menopause.  The most common causes of vaginosis are:   Bacterial vaginosis which results from an overgrowth of one on several organisms that are normally present in your vagina.   Yeast infections which are caused by a naturally occurring fungus called candida.   Vaginal atrophy (atrophic vaginosis) which results from the thinning of the vagina from reduced estrogen levels after menopause.   Trichomoniasis which is caused by a parasite and is commonly transmitted by sexual intercourse.  Factors that increase your risk of developing vaginosis include: Medications, such as antibiotics and steroids Uncontrolled diabetes Use of hygiene products such as bubble bath, vaginal spray or vaginal deodorant Douching Wearing damp or tight-fitting clothing Using an intrauterine device (IUD) for birth control Hormonal changes, such as those associated with pregnancy, birth control pills or menopause Sexual activity Having a sexually transmitted infection  Your treatment plan is A single Diflucan (fluconazole) 150mg tablet once.  I have electronically sent this prescription into the pharmacy that you have chosen.  Be sure to take all of the medication as directed. Stop taking any medication if you develop a rash, tongue swelling or shortness of breath. Mothers who are breast feeding should consider pumping and discarding their breast milk while on these antibiotics. However, there is no consensus that infant exposure at these doses would be harmful.  Remember that medication creams can  weaken latex condoms. .   HOME CARE:  Good hygiene may prevent some types of vaginosis from recurring and may relieve some symptoms:  Avoid baths, hot tubs and whirlpool spas. Rinse soap from your outer genital area after a shower, and dry the area well to prevent irritation. Don't use scented or harsh soaps, such as those with deodorant or antibacterial action. Avoid irritants. These include scented tampons and pads. Wipe from front to back after using the toilet. Doing so avoids spreading fecal bacteria to your vagina.  Other things that may help prevent vaginosis include:  Don't douche. Your vagina doesn't require cleansing other than normal bathing. Repetitive douching disrupts the normal organisms that reside in the vagina and can actually increase your risk of vaginal infection. Douching won't clear up a vaginal infection. Use a latex condom. Both female and female latex condoms may help you avoid infections spread by sexual contact. Wear cotton underwear. Also wear pantyhose with a cotton crotch. If you feel comfortable without it, skip wearing underwear to bed. Yeast thrives in moist environments Your symptoms should improve in the next day or two.  GET HELP RIGHT AWAY IF:  You have pain in your lower abdomen ( pelvic area or over your ovaries) You develop nausea or vomiting You develop a fever Your discharge changes or worsens You have persistent pain with intercourse You develop shortness of breath, a rapid pulse, or you faint.  These symptoms could be signs of problems or infections that need to be evaluated by a medical provider now.  MAKE SURE YOU   Understand these instructions. Will watch your condition. Will get help right away if you are not   doing well or get worse.  Thank you for choosing an e-visit.  Your e-visit answers were reviewed by a board certified advanced clinical practitioner to complete your personal care plan. Depending upon the condition, your plan  could have included both over the counter or prescription medications.  Please review your pharmacy choice. Make sure the pharmacy is open so you can pick up prescription now. If there is a problem, you may contact your provider through MyChart messaging and have the prescription routed to another pharmacy.  Your safety is important to us. If you have drug allergies check your prescription carefully.   For the next 24 hours you can use MyChart to ask questions about today's visit, request a non-urgent call back, or ask for a work or school excuse. You will get an email in the next two days asking about your experience. I hope that your e-visit has been valuable and will speed your recovery.   I spent approximately 7 minutes reviewing the patient's history, current symptoms and coordinating their plan of care today.    Meds ordered this encounter  Medications   fluconazole (DIFLUCAN) 150 MG tablet    Sig: Take 1 tablet (150 mg total) by mouth once for 1 dose.    Dispense:  1 tablet    Refill:  0    

## 2021-08-22 ENCOUNTER — Telehealth: Payer: BC Managed Care – PPO | Admitting: Nurse Practitioner

## 2021-08-22 DIAGNOSIS — B3731 Acute candidiasis of vulva and vagina: Secondary | ICD-10-CM | POA: Diagnosis not present

## 2021-08-22 MED ORDER — FLUCONAZOLE 150 MG PO TABS: 150.0000 mg | ORAL_TABLET | Freq: Once | ORAL | 0 refills | Status: AC

## 2021-08-22 NOTE — Progress Notes (Signed)

## 2021-08-22 NOTE — Progress Notes (Signed)
I have spent 5 minutes in review of e-visit questionnaire, review and updating patient chart, medical decision making and response to patient.  ° °Sherene Plancarte W Rateel Beldin, NP ° °  °

## 2021-09-16 DIAGNOSIS — F411 Generalized anxiety disorder: Secondary | ICD-10-CM | POA: Diagnosis not present

## 2021-09-16 DIAGNOSIS — F900 Attention-deficit hyperactivity disorder, predominantly inattentive type: Secondary | ICD-10-CM | POA: Diagnosis not present

## 2021-11-23 ENCOUNTER — Other Ambulatory Visit: Payer: Self-pay | Admitting: Cardiovascular Disease

## 2021-11-23 DIAGNOSIS — Z1231 Encounter for screening mammogram for malignant neoplasm of breast: Secondary | ICD-10-CM | POA: Diagnosis not present

## 2021-11-23 DIAGNOSIS — I10 Essential (primary) hypertension: Secondary | ICD-10-CM | POA: Diagnosis not present

## 2021-11-23 DIAGNOSIS — Z6841 Body Mass Index (BMI) 40.0 and over, adult: Secondary | ICD-10-CM | POA: Diagnosis not present

## 2021-11-23 DIAGNOSIS — Z01419 Encounter for gynecological examination (general) (routine) without abnormal findings: Secondary | ICD-10-CM | POA: Diagnosis not present

## 2021-11-23 NOTE — Telephone Encounter (Signed)
Rx(s) sent to pharmacy electronically.  

## 2022-01-16 ENCOUNTER — Telehealth: Payer: BC Managed Care – PPO | Admitting: Nurse Practitioner

## 2022-01-16 DIAGNOSIS — B3731 Acute candidiasis of vulva and vagina: Secondary | ICD-10-CM

## 2022-01-16 MED ORDER — FLUCONAZOLE 150 MG PO TABS: 150.0000 mg | ORAL_TABLET | Freq: Once | ORAL | 0 refills | Status: AC

## 2022-01-16 NOTE — Progress Notes (Signed)
I have spent 5 minutes in review of e-visit questionnaire, review and updating patient chart, medical decision making and response to patient.  ° °Jerrell Mangel W Secilia Apps, NP ° °  °

## 2022-02-17 ENCOUNTER — Other Ambulatory Visit: Payer: Self-pay | Admitting: Cardiovascular Disease

## 2022-02-17 NOTE — Telephone Encounter (Signed)
Rx request sent to pharmacy.  

## 2022-03-06 ENCOUNTER — Other Ambulatory Visit (HOSPITAL_BASED_OUTPATIENT_CLINIC_OR_DEPARTMENT_OTHER): Payer: Self-pay | Admitting: Cardiovascular Disease

## 2022-03-08 ENCOUNTER — Other Ambulatory Visit (HOSPITAL_BASED_OUTPATIENT_CLINIC_OR_DEPARTMENT_OTHER): Payer: Self-pay | Admitting: Cardiovascular Disease

## 2022-03-08 NOTE — Telephone Encounter (Signed)
Rx refill sent to pharmacy. 

## 2022-03-09 DIAGNOSIS — F410 Panic disorder [episodic paroxysmal anxiety] without agoraphobia: Secondary | ICD-10-CM | POA: Diagnosis not present

## 2022-03-09 DIAGNOSIS — F900 Attention-deficit hyperactivity disorder, predominantly inattentive type: Secondary | ICD-10-CM | POA: Diagnosis not present

## 2022-04-05 ENCOUNTER — Other Ambulatory Visit (HOSPITAL_BASED_OUTPATIENT_CLINIC_OR_DEPARTMENT_OTHER): Payer: Self-pay | Admitting: Cardiovascular Disease

## 2022-04-06 ENCOUNTER — Telehealth: Payer: BC Managed Care – PPO | Admitting: Physician Assistant

## 2022-04-06 DIAGNOSIS — B3731 Acute candidiasis of vulva and vagina: Secondary | ICD-10-CM

## 2022-04-06 MED ORDER — FLUCONAZOLE 150 MG PO TABS: ORAL_TABLET | ORAL | 0 refills | Status: DC

## 2022-04-06 NOTE — Progress Notes (Signed)
I have spent 5 minutes in review of e-visit questionnaire, review and updating patient chart, medical decision making and response to patient.   Fawn Desrocher Cody Tamara Kenyon, PA-C    

## 2022-04-06 NOTE — Progress Notes (Signed)

## 2022-04-19 ENCOUNTER — Telehealth: Payer: BC Managed Care – PPO | Admitting: Physician Assistant

## 2022-04-19 DIAGNOSIS — J208 Acute bronchitis due to other specified organisms: Secondary | ICD-10-CM | POA: Diagnosis not present

## 2022-04-19 MED ORDER — PREDNISONE 10 MG (21) PO TBPK: ORAL_TABLET | ORAL | 0 refills | Status: DC

## 2022-04-19 MED ORDER — BENZONATATE 100 MG PO CAPS: 100.0000 mg | ORAL_CAPSULE | Freq: Three times a day (TID) | ORAL | 0 refills | Status: DC | PRN

## 2022-04-19 NOTE — Progress Notes (Signed)
We are sorry that you are not feeling well.  Here is how we plan to help! ? ?Based on your presentation I believe you most likely have A cough due to a virus.  This is called viral bronchitis and is best treated by rest, plenty of fluids and control of the cough.  You may use Ibuprofen or Tylenol as directed to help your symptoms.   ?  ?In addition you may use A non-prescription cough medication called Mucinex DM: take 2 tablets every 12 hours. and A prescription cough medication called Tessalon Perles 100mg. You may take 1-2 capsules every 8 hours as needed for your cough. ? ?Prednisone 10 mg daily for 6 days (see taper instructions below) ? ?Directions for 6 day taper: ?Day 1: 2 tablets before breakfast, 1 after both lunch & dinner and 2 at bedtime ?Day 2: 1 tab before breakfast, 1 after both lunch & dinner and 2 at bedtime ?Day 3: 1 tab at each meal & 1 at bedtime ?Day 4: 1 tab at breakfast, 1 at lunch, 1 at bedtime ?Day 5: 1 tab at breakfast & 1 tab at bedtime ?Day 6: 1 tab at breakfast ? ?From your responses in the eVisit questionnaire you describe inflammation in the upper respiratory tract which is causing a significant cough.  This is commonly called Bronchitis and has four common causes:   ?Allergies ?Viral Infections ?Acid Reflux ?Bacterial Infection ?Allergies, viruses and acid reflux are treated by controlling symptoms or eliminating the cause. An example might be a cough caused by taking certain blood pressure medications. You stop the cough by changing the medication. Another example might be a cough caused by acid reflux. Controlling the reflux helps control the cough. ? ?USE OF BRONCHODILATOR ("RESCUE") INHALERS: ?There is a risk from using your bronchodilator too frequently.  The risk is that over-reliance on a medication which only relaxes the muscles surrounding the breathing tubes can reduce the effectiveness of medications prescribed to reduce swelling and congestion of the tubes themselves.   Although you feel brief relief from the bronchodilator inhaler, your asthma may actually be worsening with the tubes becoming more swollen and filled with mucus.  This can delay other crucial treatments, such as oral steroid medications. If you need to use a bronchodilator inhaler daily, several times per day, you should discuss this with your provider.  There are probably better treatments that could be used to keep your asthma under control.  ?   ?HOME CARE ?Only take medications as instructed by your medical team. ?Complete the entire course of an antibiotic. ?Drink plenty of fluids and get plenty of rest. ?Avoid close contacts especially the very young and the elderly ?Cover your mouth if you cough or cough into your sleeve. ?Always remember to wash your hands ?A steam or ultrasonic humidifier can help congestion.  ? ?GET HELP RIGHT AWAY IF: ?You develop worsening fever. ?You become short of breath ?You cough up blood. ?Your symptoms persist after you have completed your treatment plan ?MAKE SURE YOU  ?Understand these instructions. ?Will watch your condition. ?Will get help right away if you are not doing well or get worse. ?  ? ?Thank you for choosing an e-visit. ? ?Your e-visit answers were reviewed by a board certified advanced clinical practitioner to complete your personal care plan. Depending upon the condition, your plan could have included both over the counter or prescription medications. ? ?Please review your pharmacy choice. Make sure the pharmacy is open so you can   pick up prescription now. If there is a problem, you may contact your provider through MyChart messaging and have the prescription routed to another pharmacy.  Your safety is important to us. If you have drug allergies check your prescription carefully.  ? ?For the next 24 hours you can use MyChart to ask questions about today's visit, request a non-urgent call back, or ask for a work or school excuse. ?You will get an email in the next  two days asking about your experience. I hope that your e-visit has been valuable and will speed your recovery. ? ?I provided 5 minutes of non face-to-face time during this encounter for chart review and documentation.  ? ?

## 2022-06-04 ENCOUNTER — Other Ambulatory Visit: Payer: Self-pay | Admitting: Cardiovascular Disease

## 2022-06-04 NOTE — Telephone Encounter (Signed)
Rx(s) sent to pharmacy electronically.  

## 2022-07-01 ENCOUNTER — Other Ambulatory Visit: Payer: Self-pay | Admitting: Cardiovascular Disease

## 2022-07-01 NOTE — Telephone Encounter (Signed)
Rx request sent to pharmacy.  

## 2022-07-03 ENCOUNTER — Other Ambulatory Visit (HOSPITAL_BASED_OUTPATIENT_CLINIC_OR_DEPARTMENT_OTHER): Payer: Self-pay | Admitting: Cardiovascular Disease

## 2022-07-05 NOTE — Telephone Encounter (Signed)
Rx request sent to pharmacy.  

## 2022-09-09 DIAGNOSIS — F900 Attention-deficit hyperactivity disorder, predominantly inattentive type: Secondary | ICD-10-CM | POA: Diagnosis not present

## 2022-09-09 DIAGNOSIS — F410 Panic disorder [episodic paroxysmal anxiety] without agoraphobia: Secondary | ICD-10-CM | POA: Diagnosis not present

## 2022-09-10 ENCOUNTER — Other Ambulatory Visit: Payer: Self-pay | Admitting: Cardiovascular Disease

## 2022-09-10 ENCOUNTER — Telehealth (HOSPITAL_BASED_OUTPATIENT_CLINIC_OR_DEPARTMENT_OTHER): Payer: Self-pay | Admitting: Cardiovascular Disease

## 2022-09-10 NOTE — Telephone Encounter (Signed)
*  STAT* If patient is at the pharmacy, call can be transferred to refill team.   1. Which medications need to be refilled? (please list name of each medication and dose if known) amLODipine (NORVASC) 5 MG tablet   valsartan-hydrochlorothiazide (DIOVAN-HCT) 320-25 MG tablet   2. Which pharmacy/location (including street and city if local pharmacy) is medication to be sent to? [place],  - [address] AT [place]   3. Do they need a 30 day or 90 day supply? 90   Patient has appt 11/19/22

## 2022-09-13 ENCOUNTER — Other Ambulatory Visit (HOSPITAL_BASED_OUTPATIENT_CLINIC_OR_DEPARTMENT_OTHER): Payer: Self-pay | Admitting: Cardiovascular Disease

## 2022-09-13 MED ORDER — AMLODIPINE BESYLATE 5 MG PO TABS: 5.0000 mg | ORAL_TABLET | Freq: Every day | ORAL | 0 refills | Status: DC

## 2022-09-13 MED ORDER — VALSARTAN-HYDROCHLOROTHIAZIDE 320-25 MG PO TABS: 1.0000 | ORAL_TABLET | Freq: Every day | ORAL | 0 refills | Status: DC

## 2022-09-13 NOTE — Telephone Encounter (Signed)
Need to keep office visit in order to get 90 day supply. Sent in 30 day supply until upcoming office visit.

## 2022-10-10 ENCOUNTER — Telehealth: Payer: BC Managed Care – PPO | Admitting: Physician Assistant

## 2022-10-10 ENCOUNTER — Telehealth: Payer: BC Managed Care – PPO

## 2022-10-10 DIAGNOSIS — B3731 Acute candidiasis of vulva and vagina: Secondary | ICD-10-CM | POA: Diagnosis not present

## 2022-10-10 MED ORDER — FLUCONAZOLE 150 MG PO TABS: ORAL_TABLET | ORAL | 0 refills | Status: DC

## 2022-10-10 NOTE — Progress Notes (Signed)

## 2022-11-05 ENCOUNTER — Other Ambulatory Visit (HOSPITAL_BASED_OUTPATIENT_CLINIC_OR_DEPARTMENT_OTHER): Payer: Self-pay | Admitting: Cardiovascular Disease

## 2022-11-05 MED ORDER — AMLODIPINE BESYLATE 5 MG PO TABS: 5.0000 mg | ORAL_TABLET | Freq: Every day | ORAL | 0 refills | Status: DC

## 2022-11-05 NOTE — Telephone Encounter (Signed)
Rx(s) sent to pharmacy electronically.  

## 2022-11-19 ENCOUNTER — Encounter (HOSPITAL_BASED_OUTPATIENT_CLINIC_OR_DEPARTMENT_OTHER): Payer: Self-pay | Admitting: Cardiovascular Disease

## 2022-11-19 ENCOUNTER — Ambulatory Visit (HOSPITAL_BASED_OUTPATIENT_CLINIC_OR_DEPARTMENT_OTHER): Payer: BC Managed Care – PPO | Admitting: Cardiovascular Disease

## 2022-11-19 VITALS — BP 156/82 | HR 89 | Ht 65.0 in | Wt 274.8 lb

## 2022-11-19 DIAGNOSIS — E119 Type 2 diabetes mellitus without complications: Secondary | ICD-10-CM | POA: Diagnosis not present

## 2022-11-19 DIAGNOSIS — I10 Essential (primary) hypertension: Secondary | ICD-10-CM

## 2022-11-19 DIAGNOSIS — R0683 Snoring: Secondary | ICD-10-CM

## 2022-11-19 HISTORY — DX: Snoring: R06.83

## 2022-11-19 LAB — COMPREHENSIVE METABOLIC PANEL
ALT: 10 IU/L (ref 0–32)
AST: 11 IU/L (ref 0–40)
Albumin/Globulin Ratio: 1.5 (ref 1.2–2.2)
Albumin: 4.3 g/dL (ref 3.9–4.9)
Alkaline Phosphatase: 62 IU/L (ref 44–121)
BUN/Creatinine Ratio: 19 (ref 9–23)
BUN: 16 mg/dL (ref 6–24)
Bilirubin Total: 0.4 mg/dL (ref 0.0–1.2)
CO2: 23 mmol/L (ref 20–29)
Calcium: 10.1 mg/dL (ref 8.7–10.2)
Chloride: 102 mmol/L (ref 96–106)
Creatinine, Ser: 0.85 mg/dL (ref 0.57–1.00)
Globulin, Total: 2.9 g/dL (ref 1.5–4.5)
Glucose: 251 mg/dL — ABNORMAL HIGH (ref 70–99)
Potassium: 4.7 mmol/L (ref 3.5–5.2)
Sodium: 140 mmol/L (ref 134–144)
Total Protein: 7.2 g/dL (ref 6.0–8.5)
eGFR: 87 mL/min/{1.73_m2} (ref >59)

## 2022-11-19 LAB — LIPID PANEL
Chol/HDL Ratio: 3.1 ratio (ref 0.0–4.4)
Cholesterol, Total: 141 mg/dL (ref 100–199)
HDL: 46 mg/dL (ref >39)
LDL Chol Calc (NIH): 76 mg/dL (ref 0–99)
Triglycerides: 105 mg/dL (ref 0–149)
VLDL Cholesterol Cal: 19 mg/dL (ref 5–40)

## 2022-11-19 MED ORDER — AMLODIPINE BESYLATE 10 MG PO TABS: 10.0000 mg | ORAL_TABLET | Freq: Every day | ORAL | 3 refills | Status: DC

## 2022-11-19 NOTE — Assessment & Plan Note (Signed)
Blood pressure elevated in the office.  She isn't checking it at home regularly.  We will increase amlodipine to 10mg .  Continue valsartan/HCTZ.  She is to work on limiting her sodium intake and increasing her exercise.  Her goal is at least 150 minutes weekly.  She will track her blood pressures at home and we will have a virtual follow-up in a month.

## 2022-11-19 NOTE — Progress Notes (Deleted)
Advanced Hypertension Clinic Follow-Up:    Date:  11/19/2022   ID:  Molly Clements, DOB Mar 14, 1981, MRN [id number]  PCP:  Fleet Contras, MD  Cardiologist:  None  Nephrologist:  Referring MD: Fleet Contras, MD   CC: Hypertension  History of Present Illness:    Molly Clements is a 42 y.o. female with a hx of hypertension, despression and anxiety here for follow-up. She initially established care in the hypertension clinic 02/21/2020. She was first diagnosed with hypertension about 10 years prior.  Her BP has never been well-controlled.  She saw Dr. Cherly Hensen on 11/19/19 and her BP was 160/90.  She was referred to the [facility] for further management and to consider the appropriate medications as she is interested in getting pregnant.  She has been on losartan/HCTZ for many years.  She was started on nifedipine and referred to [facility]. She followed up with our pharmacist and her blood pressure was poorly controlled so labetalol and hydralazine were added. Cortisol was ordered but the labs were not obtained.   Today, she is feeling good overall. She has self-discontinued labetalol and nifedipine.   At her visit 02/2021 BP was uncontrolled in the setting of being off her medication x 2 weeks. Prior to that BP was in the 130s/60. She noted LE edema.  She was restarted on valsartan/HCTZ and encouraged to exericse in [facility].  She was tachycardic to 116 bpm.  TSH, CBC and CMP were unremarkable other than hyperglycemia.    Previous antihypertensives: Losartan/hctz Labetalol- ran out Nifedipine- ran out Hydralazine- ran out  Past Medical History:  Diagnosis Date   Anxiety    Depression    Hypertension    Migraine    PCOS (polycystic ovarian syndrome)    Tachycardia 03/24/2021    Past Surgical History:  Procedure Laterality Date   ENDOMETRIAL BIOPSY  2008   polypectomy    Current Medications: No outpatient medications have been marked as taking for the 11/19/22  encounter (Appointment) with Chilton Si, MD.     Allergies:   Bee venom and Labetalol   Social History   Socioeconomic History   Marital status: Married    Spouse name: n/a   Number of children: 0   Years of education: 13.5   Highest education level: Not on file  Occupational History   Occupation: customer service  Tobacco Use   Smoking status: Never   Smokeless tobacco: Never  Substance and Sexual Activity   Alcohol use: No   Drug use: No   Sexual activity: Yes    Partners: Male    Birth control/protection: Pill  Other Topics Concern   Not on file  Social History Narrative   Lives with fiance [name]   Social Determinants of Health   Financial Resource Strain: Low Risk  (03/24/2021)   Overall Financial Resource Strain (CARDIA)    Difficulty of Paying Living Expenses: Not very hard  Food Insecurity: Food Insecurity Present (03/24/2021)   Hunger Vital Sign    Worried About Running Out of Food in the Last Year: Sometimes true    Ran Out of Food in the Last Year: Sometimes true  Transportation Needs: Unmet Transportation Needs (03/24/2021)   PRAPARE - Transportation    Lack of Transportation (Medical): Yes    Lack of Transportation (Non-Medical): Yes  Physical Activity: Inactive (03/24/2021)   Exercise Vital Sign    Days of Exercise per Week: 0 days    Minutes of Exercise per Session: 0 min  Stress: Stress Concern Present (03/24/2021)   [employer] of Occupational Health - Occupational Stress Questionnaire    Feeling of Stress : To some extent  Social Connections: Not on file     Family History: The patient's family history includes Atrial fibrillation in her mother; Cancer in her paternal grandmother; Depression in her father; Drug abuse in her father; Heart failure in her mother; Hypertension in her maternal grandfather and mother; Obesity in her sister; Stroke in her maternal grandmother.  ROS:   Please see the history of present illness.    (+)  Bilateral LE edema All other systems reviewed and are negative.  EKGs/Labs/Other Studies Reviewed:    No prior CV studies available.  EKG:  03/24/2021: Sinus tachycardia. Rate 116 bpm. Cannot rule out prior anteroseptal infarct. 02/21/20: Sinus tachycardia.  Rate 109 bpm.  Inferolateral ST abnormality.  Cannot rule out prior anteroseptal infarct  Recent Labs: No results found for requested labs within last 365 days.   Recent Lipid Panel    Component Value Date/Time   CHOL 151 04/03/2021 1157   TRIG 167 (H) 04/03/2021 1157   HDL 35 (L) 04/03/2021 1157   CHOLHDL 4.3 04/03/2021 1157   CHOLHDL 4.3 03/23/2015 1028   VLDL 25 03/23/2015 1028   LDLCALC 87 04/03/2021 1157    Physical Exam:    VS:  There were no vitals taken for this visit. , BMI There is no height or weight on file to calculate BMI. GENERAL:  Well appearing HEENT: Pupils equal round and reactive, fundi not visualized, oral mucosa unremarkable NECK:  No jugular venous distention, waveform within normal limits, carotid upstroke brisk and symmetric, no bruits, no thyromegaly LYMPHATICS:  No cervical adenopathy LUNGS:  Clear to auscultation bilaterally HEART:  RRR.  PMI not displaced or sustained,S1 and S2 within normal limits, no S3, no S4, no clicks, no rubs, no murmurs ABD:  Flat, positive bowel sounds normal in frequency in pitch, no bruits, no rebound, no guarding, no midline pulsatile mass, no hepatomegaly, no splenomegaly EXT:  2 plus pulses throughout, trace edema, no cyanosis no clubbing SKIN:  No rashes no nodules NEURO:  Cranial nerves II through XII grossly intact, motor grossly intact throughout PSYCH:  Cognitively intact, oriented to person place and time  ASSESSMENT:    No diagnosis found.    PLAN:   No problem-specific Assessment & Plan notes found for this encounter.    Disposition:    FU with MD/PharmD in 1 month.    Medication Adjustments/Labs and Tests Ordered: Current medicines are  reviewed at length with the patient today.  Concerns regarding medicines are outlined above.  No orders of the defined types were placed in this encounter.   No orders of the defined types were placed in this encounter.   I,Mathew Stumpf,acting as a Neurosurgeon for Chilton Si, MD.,have documented all relevant documentation on the behalf of Chilton Si, MD,as directed by  Chilton Si, MD while in the presence of Chilton Si, MD.  I, Ahliya Glatt C. Duke Salvia, MD have reviewed all documentation for this visit.  The documentation of the exam, diagnosis, procedures, and orders on 11/19/2022 are all accurate and complete.   Signed, Chilton Si, MD  11/19/2022 7:55 AM    [facility]

## 2022-11-19 NOTE — Assessment & Plan Note (Signed)
She has a goal of losing 50 pounds this year.  She is going to start working on diet and exercise as above.

## 2022-11-19 NOTE — Patient Instructions (Signed)
Medication Instructions:  Your physician has recommended you make the following change in your medication:   Increase: Amlodipine 10mg  daily  *If you need a refill on your cardiac medications before your next appointment, please call your pharmacy*   Lab Work: Your physician recommends that you return for lab work today- Lipid Panel and CMP   If you have labs (blood work) drawn today and your tests are completely normal, you will receive your results only by: MyChart Message (if you have MyChart) OR A paper copy in the mail If you have any lab test that is abnormal or we need to change your treatment, we will call you to review the results.   Testing/Procedures: WatchPAT?  Is a FDA cleared portable home sleep study test that uses a watch and 3 points of contact to monitor 7 different channels, including your heart rate, oxygen saturations, body position, snoring, and chest motion.  The study is easy to use from the comfort of your own home and accurately detect sleep apnea.  Before bed, you attach the chest sensor, attached the sleep apnea bracelet to your nondominant hand, and attach the finger probe.  After the study, the raw data is downloaded from the watch and scored for apnea events.   For more information: [email or web address]  Patient Testing Instructions:  Do not put battery into the device until bedtime when you are ready to begin the test. Please call the support number if you need assistance after following the instructions below: 24 hour support line- [phone] or [facility] support at [phone] (option 2)  Download the IntelWatchPAT One" app through the google play store or App Store  Be sure to turn on or enable access to bluetooth in settlings on your smartphone/ device  Make sure no other bluetooth devices are on and within the vicinity of your smartphone/ device and WatchPAT watch during testing.  Make sure to leave your smart phone/ device  plugged in and charging all night.  When ready for bed:  Follow the instructions step by step in the WatchPAT One App to activate the testing device. For additional instructions, including video instruction, visit the WatchPAT One video on Youtube. You can search for WatchPat One within Youtube (video is 4 minutes and 18 seconds) or enter: https://youtube/[email or web address]?[email or web address] Please note: You will be prompted to enter a Pin to connect via bluetooth when starting the test. The PIN will be assigned to you when you receive the test.  The device is disposable, but it recommended that you retain the device until you receive a call letting you know the study has been received and the results have been interpreted.  We will let you know if the study did not transmit to [place] properly after the test is completed. You do not need to call us to confirm the receipt of the test.  Please complete the test within 48 hours of receiving PIN.   Frequently Asked Questions:  What is Watch Dennie Bible one?  A single use fully disposable home sleep apnea testing device and will not need to be returned after completion.  What are the requirements to use WatchPAT one?  The be able to have a successful watchpat one sleep study, you should have your Watch pat one device, your smart phone, watch pat one app, your PIN number and Internet access What type of phone do I need?  You should have a smart phone that uses Android 5.1 and above or any Iphone with IOS 10 and above  How can I download the WatchPAT one app?  Based on your device type search for WatchPAT one app either in google play for android devices or APP store for Iphone's Where will I get my PIN for the study?  Your PIN will be provided by your physician's office. It is used for authentication and if you lose/forget your PIN, please reach out to your providers office.  I do not have Internet at home. Can I do WatchPAT one study?  WatchPAT One needs Internet connection  throughout the night to be able to transmit the sleep data. You can use your home/local internet or your cellular's data package. However, it is always recommended to use home/local Internet. It is estimated that between 20MB-30MB will be used with each study.However, the application will be looking for space in the phone to start the study.  What happens if I lose internet or bluetooth connection?  During the internet disconnection, your phone will not be able to transmit the sleep data. All the data, will be stored in your phone. As soon as the internet connection is back on, the phone will being sending the sleep data. During the bluetooth disconnection, WatchPAT one will not be able to to send the sleep data to your phone. Data will be kept in the [facility] one until two devices have bluetooth connection back on. As soon as the connection is back on, WatchPAT one will send the sleep data to the phone.  How long do I need to wear the WatchPAT one?  After you start the study, you should wear the device at least 6 hours.  How far should I keep my phone from the device?  During the night, your phone should be within 15 feet.  What happens if I leave the room for restroom or other reasons?  Leaving the room for any reason will not cause any problem. As soon as your get back to the room, both devices will reconnect and will continue to send the sleep data. Can I use my phone during the sleep study?  Yes, you can use your phone as usual during the study. But it is recommended to put your watchpat one on when you are ready to go to bed.  How will I get my study results?  A soon as you completed your study, your sleep data will be sent to the provider. They will then share the results with you when they are ready.    Follow-Up: At [facility], you and your health needs are our priority.  As part of our continuing mission to provide you with exceptional heart care, we have created designated  Provider Care Teams.  These Care Teams include your primary Cardiologist (physician) and Advanced Practice Providers (APPs -  Physician Assistants and Nurse Practitioners) who all work together to provide you with the care you need, when you need it.  We recommend signing up for the patient portal called "MyChart".  Sign up information is provided on this After Visit Summary.  MyChart is used to connect with patients for Virtual Visits (Telemedicine).  Patients are able to view lab/test results, encounter notes, upcoming appointments, etc.  Non-urgent messages can be sent to your provider as well.   To learn more about what you can do with MyChart, go to [email or web address].    Your next appointment:   1 month Virtual Follow up with Molly Shields, Molly Clements   &   1 year with Molly Clements   Other  Instructions Tips to Measure your Blood Pressure Correctly  To determine whether you have hypertension, a medical professional will take a blood pressure reading. How you prepare for the test, the position of your arm, and other factors can change a blood pressure reading by 10% or more. That could be enough to hide high blood pressure, start you on a drug you don't really need, or lead your doctor to incorrectly adjust your medications.  National and international guidelines offer specific instructions for measuring blood pressure. If a doctor, nurse, or medical assistant isn't doing it right, don't hesitate to ask him or her to get with the guidelines.  Here's what you can do to ensure a correct reading:  Don't drink a caffeinated beverage or smoke during the 30 minutes before the test.  Sit quietly for five minutes before the test begins.  During the measurement, sit in a chair with your feet on the floor and your arm supported so your elbow is at about heart level.  The inflatable part of the cuff should completely cover at least 80% of your upper arm, and the cuff should be placed on bare skin, not  over a shirt.  Don't talk during the measurement.  Have your blood pressure measured twice, with a brief break in between. If the readings are different by 5 points or more, have it done a third time.  In 2017, new guidelines from the American Heart Association, the [employer] of Cardiology, and nine other health organizations lowered the diagnosis of high blood pressure to 130/80 mm Hg or higher for all adults. The guidelines also redefined the various blood pressure categories to now include normal, elevated, Stage 1 hypertension, Stage 2 hypertension, and hypertensive crisis (see "Blood pressure categories").  Blood pressure categories  Blood pressure category SYSTOLIC (upper number)  DIASTOLIC (lower number)  Normal Less than 120 mm Hg and Less than 80 mm Hg  Elevated 120-129 mm Hg and Less than 80 mm Hg  High blood pressure: Stage 1 hypertension 130-139 mm Hg or 80-89 mm Hg  High blood pressure: Stage 2 hypertension 140 mm Hg or higher or 90 mm Hg or higher  Hypertensive crisis (consult your doctor immediately) Higher than 180 mm Hg and/or Higher than 120 mm Hg  Source: American Heart Association and American Stroke Association. For more on getting your blood pressure under control, buy Controlling Your Blood Pressure, a Special Health Report from [facility].   Blood Pressure Log   Date   Time  Blood Pressure  Position  Example: Nov 1 9 AM 124/78 sitting

## 2022-11-19 NOTE — Assessment & Plan Note (Signed)
Check fasting lipids and a CMP today. 

## 2022-11-19 NOTE — Assessment & Plan Note (Addendum)
Her husband notes that she snores loudly and she has some daytime somnolence.  Will check an [facility] home sleep study.

## 2022-11-19 NOTE — Progress Notes (Signed)
Advanced Hypertension Clinic Follow-Up:    Date:  11/19/2022   ID:  Molly Clements, DOB 1981/06/18, MRN [id number]  PCP:  Fleet Contras, MD  Cardiologist:  None  Nephrologist:  Referring MD: Fleet Contras, MD   CC: Hypertension  History of Present Illness:    Molly Clements is a 42 y.o. female with a hx of hypertension, despression and anxiety here for follow-up. She initially established care in the hypertension clinic 02/21/2020. She was first diagnosed with hypertension about 10 years prior.  Her BP has never been well-controlled.  She saw Dr. Cherly Hensen on 11/19/19 and her BP was 160/90.  She was referred to the [facility] Hypertension Clinic for further management and to consider the appropriate medications as she is interested in getting pregnant.  She has been on losartan/HCTZ for many years.  She was started on nifedipine and referred to PREP. She followed up with our pharmacist and her blood pressure was poorly controlled so labetalol and hydralazine were added. Cortisol was ordered but the labs were not obtained.   Today, she is feeling good overall. She has self-discontinued labetalol and nifedipine.   At her visit 02/2021 BP was uncontrolled in the setting of being off her medication x 2 weeks. Prior to that BP was in the 130s/60. She noted LE edema.  She was restarted on valsartan/HCTZ and encouraged to exericse in PREP.  She was tachycardic to 116 bpm.  TSH, CBC and CMP were unremarkable other than hyperglycemia.  Today, she states that she has been doing well overall without any new concerns. She rarely checks her blood pressure at home. She takes all of her blood pressure medications together around 5PM, but notes that she has been inconsistent with taking her medications due to forgetfulness after work.  She has noticed some leg swelling intermittently which she attributes to salt in her diet. She has not been exercising. She states that she plans to lose weight this year  with a goal of 50lbs. She has tried to cut out sodas, fried foods, and most sweets. She wants to start a keto diet.  She denies any chest pain or pressure or shortness of breath.  She states that she snores per her husband, but feels well rested when she wakes up and does not experience any excessive daytime sleepiness or witnessed apneic episodes. She is interested in doing a home sleep study.   Previous antihypertensives: Losartan/hctz Labetalol- ran out Nifedipine- ran out Hydralazine- ran out  Past Medical History:  Diagnosis Date   Anxiety    Depression    Hypertension    Migraine    PCOS (polycystic ovarian syndrome)    Snoring 11/19/2022   Tachycardia 03/24/2021    Past Surgical History:  Procedure Laterality Date   ENDOMETRIAL BIOPSY  2008   polypectomy    Current Medications: Current Meds  Medication Sig   ADDERALL XR 30 MG 24 hr capsule Take 30 mg by mouth daily as needed.   ALPRAZolam (XANAX) 0.5 MG tablet Take 0.5-1 mg by mouth 3 (three) times daily as needed.   fluconazole (DIFLUCAN) 150 MG tablet Take 1 tablet PO once. Repeat in 3 days if needed.   fluticasone (FLONASE) 50 MCG/ACT nasal spray Place 2 sprays into both nostrils daily.   naproxen (NAPROSYN) 500 MG tablet Take 1 tablet (500 mg total) by mouth 2 (two) times daily with a meal.   nystatin-triamcinolone (MYCOLOG II) cream Apply 1 application topically as needed.   valsartan-hydrochlorothiazide (DIOVAN-HCT) 320-25  MG tablet Take 1 tablet by mouth daily. KEEP OV FOR 90 DAY SUPPLY.   [DISCONTINUED] amLODipine (NORVASC) 5 MG tablet Take 1 tablet (5 mg total) by mouth daily. KEEP OV FOR 90 DAY SUPPLY.     Allergies:   Bee venom and Labetalol   Social History   Socioeconomic History   Marital status: Married    Spouse name: n/a   Number of children: 0   Years of education: 13.5   Highest education level: Not on file  Occupational History   Occupation: customer service  Tobacco Use   Smoking  status: Never   Smokeless tobacco: Never  Substance and Sexual Activity   Alcohol use: No   Drug use: No   Sexual activity: Yes    Partners: Male    Birth control/protection: Pill  Other Topics Concern   Not on file  Social History Narrative   Lives with fiance Molly Clements   Social Determinants of Health   Financial Resource Strain: Low Risk  (03/24/2021)   Overall Financial Resource Strain (CARDIA)    Difficulty of Paying Living Expenses: Not very hard  Food Insecurity: Food Insecurity Present (03/24/2021)   Hunger Vital Sign    Worried About Running Out of Food in the Last Year: Sometimes true    Ran Out of Food in the Last Year: Sometimes true  Transportation Needs: Unmet Transportation Needs (03/24/2021)   PRAPARE - Transportation    Lack of Transportation (Medical): Yes    Lack of Transportation (Non-Medical): Yes  Physical Activity: Inactive (03/24/2021)   Exercise Vital Sign    Days of Exercise per Week: 0 days    Minutes of Exercise per Session: 0 min  Stress: Stress Concern Present (03/24/2021)   [employer] of Occupational Health - Occupational Stress Questionnaire    Feeling of Stress : To some extent  Social Connections: Not on file     Family History: The patient's family history includes Atrial fibrillation in her mother; Cancer in her paternal grandmother; Depression in her father; Drug abuse in her father; Heart failure in her mother; Hypertension in her maternal grandfather and mother; Obesity in her sister; Stroke in her maternal grandmother.  ROS:   Please see the history of present illness.    (+) LE edema (+) Snoring All other systems reviewed and are negative.  EKGs/Labs/Other Studies Reviewed:    No prior CV studies available.  EKG:  11/19/22: Sinus rhythm. Rate of 89 bpm. 03/24/2021: Sinus tachycardia. Rate 116 bpm. Cannot rule out prior anteroseptal infarct. 02/21/20: Sinus tachycardia.  Rate 109 bpm.  Inferolateral ST abnormality.  Cannot  rule out prior anteroseptal infarct  Recent Labs: No results found for requested labs within last 365 days.   Recent Lipid Panel    Component Value Date/Time   CHOL 151 04/03/2021 1157   TRIG 167 (H) 04/03/2021 1157   HDL 35 (L) 04/03/2021 1157   CHOLHDL 4.3 04/03/2021 1157   CHOLHDL 4.3 03/23/2015 1028   VLDL 25 03/23/2015 1028   LDLCALC 87 04/03/2021 1157    Physical Exam:    VS:  BP (!) 156/82 (BP Location: Right Arm, Patient Position: Sitting)   Pulse 89   Ht 5\' 5"  (1.651 m)   Wt 274 lb 12.8 oz (124.6 kg)   BMI 45.73 kg/m  , BMI Body mass index is 45.73 kg/m. GENERAL:  Well appearing HEENT: Pupils equal round and reactive, fundi not visualized, oral mucosa unremarkable NECK:  No jugular venous distention, waveform within  normal limits, carotid upstroke brisk and symmetric, no bruits, no thyromegaly LUNGS:  Clear to auscultation bilaterally HEART:  RRR.  PMI not displaced or sustained,S1 and S2 within normal limits, no S3, no S4, no clicks, no rubs, no murmurs ABD:  Flat, positive bowel sounds normal in frequency in pitch, no bruits, no rebound, no guarding, no midline pulsatile mass, no hepatomegaly, no splenomegaly EXT:  2 plus pulses throughout, trace edema, no cyanosis no clubbing SKIN:  No rashes no nodules NEURO:  Cranial nerves II through XII grossly intact, motor grossly intact throughout PSYCH:  Cognitively intact, oriented to person place and time  ASSESSMENT:    1. Essential hypertension   2. Morbid obesity (HCC)   3. Type 2 diabetes mellitus without complication, unspecified whether long term insulin use (HCC)   4. Snoring       PLAN:   Essential hypertension Blood pressure elevated in the office.  She isn't checking it at home regularly.  We will increase amlodipine to 10mg .  Continue valsartan/HCTZ.  She is to work on limiting her sodium intake and increasing her exercise.  Her goal is at least 150 minutes weekly.  She will track her blood pressures  at home and we will have a virtual follow-up in a month.  Morbid obesity (HCC) She has a goal of losing 50 pounds this year.  She is going to start working on diet and exercise as above.  DM (diabetes mellitus) Check fasting lipids and a CMP today.  Snoring Her husband notes that she snores loudly and she has some daytime somnolence.  Will check an [facility] home sleep study.    Disposition:    Virtual FU with Gillian Shields NP in 1 month.   Medication Adjustments/Labs and Tests Ordered: Current medicines are reviewed at length with the patient today.  Concerns regarding medicines are outlined above.  Orders Placed This Encounter  Procedures   Comprehensive metabolic panel   Lipid panel   EKG 12-Lead   [facility] Sleep Study    Meds ordered this encounter  Medications   amLODipine (NORVASC) 10 MG tablet    Sig: Take 1 tablet (10 mg total) by mouth daily. KEEP OV FOR 90 DAY SUPPLY.    Dispense:  90 tablet    Refill:  3    I,Alexis Herring,acting as a scribe for Chilton Si, MD.,have documented all relevant documentation on the behalf of Chilton Si, MD,as directed by  Chilton Si, MD while in the presence of Chilton Si, MD.  I, Nance Mccombs C. Duke Salvia, MD have reviewed all documentation for this visit.  The documentation of the exam, diagnosis, procedures, and orders on 11/19/2022 are all accurate and complete.   Signed, Chilton Si, MD  11/19/2022 10:30 AM     [facility]

## 2022-11-25 DIAGNOSIS — Z1231 Encounter for screening mammogram for malignant neoplasm of breast: Secondary | ICD-10-CM | POA: Diagnosis not present

## 2022-11-25 DIAGNOSIS — Z01419 Encounter for gynecological examination (general) (routine) without abnormal findings: Secondary | ICD-10-CM | POA: Diagnosis not present

## 2022-11-26 ENCOUNTER — Telehealth (HOSPITAL_BASED_OUTPATIENT_CLINIC_OR_DEPARTMENT_OTHER): Payer: Self-pay | Admitting: Cardiovascular Disease

## 2022-11-26 MED ORDER — VALSARTAN-HYDROCHLOROTHIAZIDE 320-25 MG PO TABS: 1.0000 | ORAL_TABLET | Freq: Every day | ORAL | 0 refills | Status: DC

## 2022-11-26 NOTE — Telephone Encounter (Signed)
*  STAT* If patient is at the pharmacy, call can be transferred to refill team.   1. Which medications need to be refilled? (please list name of each medication and dose if known) Valsartan/ HCTZ  2. Which pharmacy/location (including street and city if local pharmacy) is medication to be sent to?  [facility]  3. Do they need a 30 day or 90 day supply? 90 days and refills

## 2022-12-04 ENCOUNTER — Other Ambulatory Visit (HOSPITAL_BASED_OUTPATIENT_CLINIC_OR_DEPARTMENT_OTHER): Payer: Self-pay | Admitting: Cardiovascular Disease

## 2022-12-17 ENCOUNTER — Telehealth: Payer: Self-pay

## 2022-12-17 ENCOUNTER — Telehealth (INDEPENDENT_AMBULATORY_CARE_PROVIDER_SITE_OTHER): Payer: BC Managed Care – PPO | Admitting: Family

## 2022-12-17 ENCOUNTER — Encounter (HOSPITAL_BASED_OUTPATIENT_CLINIC_OR_DEPARTMENT_OTHER): Payer: Self-pay | Admitting: Family

## 2022-12-17 VITALS — BP 105/43 | HR 83 | Ht 65.0 in | Wt 270.0 lb

## 2022-12-17 DIAGNOSIS — R0683 Snoring: Secondary | ICD-10-CM | POA: Diagnosis not present

## 2022-12-17 DIAGNOSIS — E119 Type 2 diabetes mellitus without complications: Secondary | ICD-10-CM

## 2022-12-17 DIAGNOSIS — I10 Essential (primary) hypertension: Secondary | ICD-10-CM | POA: Diagnosis not present

## 2022-12-17 DIAGNOSIS — E282 Polycystic ovarian syndrome: Secondary | ICD-10-CM

## 2022-12-17 MED ORDER — VALSARTAN-HYDROCHLOROTHIAZIDE 320-25 MG PO TABS: 1.0000 | ORAL_TABLET | Freq: Every day | ORAL | 1 refills | Status: DC

## 2022-12-17 NOTE — Progress Notes (Signed)
Advanced Hypertension Clinic Virtual Visit via Video Note   Because of [name]'s co-morbid illnesses, she is at least at moderate risk for complications without adequate follow up.  This format is felt to be most appropriate for this patient at this time.  All issues noted in this document were discussed and addressed.  A limited physical exam was performed with this format.  Please refer to the patient's chart for her consent to telehealth for [facility].    Date:  12/17/2022   ID:  Molly Clements, DOB 04/17/1981, MRN [id number] The patient was identified using 2 identifiers.  Patient Location: Home Provider Location: Office/Clinic   PCP:  Fleet Contras, MD   [facility] Providers Cardiologist:  Chilton Si, MD     Evaluation Performed:  Follow-Up Visit  Chief Complaint:  Advanced Hypertension follow up   History of Present Illness:    Molly Clements is a 42 y.o. female with history of hypertension, depression, anxiety.  Established with advanced hypertension clinic 02/21/2020.  Initially diagnosed with hypertension 10 years prior.  Most recent visit 11/19/2022 with BP elevated in the office amlodipine increased to 10 mg daily.  Valsartan/HCTZ was continued.  It Ahmar home sleep study ordered due to snoring.  Follow up today via video visit. BP at home with SBP no higher than 125. Attributes previous elevated BP at some intermittent interruptions with her medications.  Has been taking routinely.  Motivated to lose weight with goal of 225 pounds by the fall which is a weight loss of approximately 50 pounds. Plans to start exercising by working in her yard and walking.  We discussed tactics for weight loss such as establishing with a weight clinic or participation in the right start exercise program.  Reports no shortness of breath nor dyspnea on exertion. Reports no chest pain, pressure, or tightness. No edema, orthopnea, PND. Reports  no palpitations.    Past Medical History:  Diagnosis Date   Anxiety    Depression    Hypertension    Migraine    PCOS (polycystic ovarian syndrome)    Snoring 11/19/2022   Tachycardia 03/24/2021   Past Surgical History:  Procedure Laterality Date   ENDOMETRIAL BIOPSY  2008   polypectomy     Current Meds  Medication Sig   ADDERALL XR 30 MG 24 hr capsule Take 30 mg by mouth daily as needed.   ALPRAZolam (XANAX) 0.5 MG tablet Take 0.5-1 mg by mouth 3 (three) times daily as needed.   amLODipine (NORVASC) 10 MG tablet Take 1 tablet (10 mg total) by mouth daily. KEEP OV FOR 90 DAY SUPPLY.   fluconazole (DIFLUCAN) 150 MG tablet Take 1 tablet PO once. Repeat in 3 days if needed.   fluticasone (FLONASE) 50 MCG/ACT nasal spray Place 2 sprays into both nostrils daily.   naproxen (NAPROSYN) 500 MG tablet Take 1 tablet (500 mg total) by mouth 2 (two) times daily with a meal.   nystatin-triamcinolone (MYCOLOG II) cream Apply 1 application topically as needed.   [DISCONTINUED] valsartan-hydrochlorothiazide (DIOVAN-HCT) 320-25 MG tablet Take 1 tablet by mouth daily. KEEP OV FOR 90 DAY SUPPLY.     Allergies:   Bee venom and Labetalol   Social History   Tobacco Use   Smoking status: Never   Smokeless tobacco: Never  Substance Use Topics   Alcohol use: No   Drug use: No     Family Hx: The patient's family history includes Atrial fibrillation in her mother;  Cancer in her paternal grandmother; Depression in her father; Drug abuse in her father; Heart failure in her mother; Hypertension in her maternal grandfather and mother; Obesity in her sister; Stroke in her maternal grandmother.  ROS:   Please see the history of present illness.     All other systems reviewed and are negative.   Labs/Other Tests and Data Reviewed:    EKG:  No ECG reviewed.  Recent Labs: 11/19/2022: ALT 10; BUN 16; Creatinine, Ser 0.85; Potassium 4.7; Sodium 140   Recent Lipid Panel Lab Results  Component  Value Date/Time   CHOL 141 11/19/2022 09:29 AM   TRIG 105 11/19/2022 09:29 AM   HDL 46 11/19/2022 09:29 AM   CHOLHDL 3.1 11/19/2022 09:29 AM   CHOLHDL 4.3 03/23/2015 10:28 AM   LDLCALC 76 11/19/2022 09:29 AM    Wt Readings from Last 3 Encounters:  12/17/22 270 lb (122.5 kg)  11/19/22 274 lb 12.8 oz (124.6 kg)  04/27/21 277 lb (125.6 kg)     Risk Assessment/Calculations:      STOP-Bang Score:  5      Objective:    Vital Signs:  BP (!) 105/43   Pulse 83   Ht 5\' 5"  (1.651 m)   Wt 270 lb (122.5 kg)   BMI 44.93 kg/m    GEN:  no acute distress RESPIRATORY:  normal respiratory effort, symmetric expansion CARDIOVASCULAR:  no peripheral edema  ASSESSMENT & PLAN:    HTN - BP well controlled. Continue current antihypertensive regimen valsartan-HCTZ 320-25 mg daily and amlodipine 10 mg daily.  Refill provided. She was educated to contact us for BP personally greater than 130/80 or less than 110/60.  She is motivated to lose weight anticipate will be able to gradually reduce antihypertensive regimen with weight loss.   Snoring- Itamar sleep study recently approved and encouraged to complete.  DM2 / PCOS / Morbid obesity -  DM2 presently managed with diet and exercise. Complicated by PCOS. Follows with PCP. Refer to [facility]. Also provided information on [facility]. Additionally provided information on Right Start Program exercise program at [facility]. Weight loss via diet and exercise encouraged. Discussed the impact being overweight would have on cardiovascular risk. She is motivated to lose weight.       Time:   Today, I have spent 16 minutes with the patient with telehealth technology discussing the above problems.     Medication Adjustments/Labs and Tests Ordered: Current medicines are reviewed at length with the patient today.  Concerns regarding medicines are outlined above.   Tests Ordered: Orders Placed This Encounter  Procedures    Ambulatory referral to Family Practice    Medication Changes: Meds ordered this encounter  Medications   valsartan-hydrochlorothiazide (DIOVAN-HCT) 320-25 MG tablet    Sig: Take 1 tablet by mouth daily.    Dispense:  90 tablet    Refill:  1    Order Specific Question:   Supervising Provider    Answer:   Jodelle Red [id number]    Follow Up:  In Person in 3 month(s) with advanced hypertension clinic  Signed, Alver Sorrow, NP  12/17/2022 10:22 AM    [facility]

## 2022-12-17 NOTE — Patient Instructions (Addendum)
Medication Instructions:  Continue your current medications.  If BP persistently <110 for the top number or you experience lightheadedness or dizziness please let us know and we may adjust your blood pressure medications.   Follow-Up: September 10th at 8:30 AM with Dr. Duke Salvia  Special Instructions:   Right Start Program at [facility]  Sessions include Structured exercise sessions 2 group sessions per week for 9 weeks Monitored by fitness app 20 to 40-minute sessions Post program complications such as a neck step.  It is free for [facility] Members or $99 for non-members. Financial assistance is available.  No referral required.  Please call [phone], visit [facility] in person, or register online at [email or web address]  _________________________  Two options for weight loss clinics (both are wonderful!)  We have referred you to [facility].  If you do not hear from them in a couple of weeks about your referral you may reach out at [phone].  [facility] [address] [place], [place] [address] [phone] Monday-Thursday 7:00 am - 5:00 pm Friday: Closed ________________________  Tips to Measure your Blood Pressure Correctly  Recommend taking blood pressure 3-5 times per week after medications. If BP consistently greater than 130/80 or less than 110/60 please contact us and let us know.  Here's what you can do to ensure a correct reading:  Don't drink a caffeinated beverage or smoke during the 30 minutes before the test.  Sit quietly for five minutes before the test begins.  During the measurement, sit in a chair with your feet on the floor and your arm supported so your elbow is at about heart level.  The inflatable part of the cuff should completely cover at least 80% of your upper arm, and the cuff should be placed on bare skin, not over a shirt.  Don't  talk during the measurement.   Blood pressure categories  Blood pressure category SYSTOLIC (upper number)  DIASTOLIC (lower number)  Normal Less than 120 mm Hg and Less than 80 mm Hg  Elevated 120-129 mm Hg and Less than 80 mm Hg  High blood pressure: Stage 1 hypertension 130-139 mm Hg or 80-89 mm Hg  High blood pressure: Stage 2 hypertension 140 mm Hg or higher or 90 mm Hg or higher  Hypertensive crisis (consult your doctor immediately) Higher than 180 mm Hg and/or Higher than 120 mm Hg  Source: American Heart Association and American Stroke Association. For more on getting your blood pressure under control, buy Controlling Your Blood Pressure, a Special Health Report from [facility].

## 2022-12-17 NOTE — Telephone Encounter (Signed)
**Note De-Identified Molly Clements Obfuscation** 5/24-READY-No Itamar PA req per Olene Floss with [facility].

## 2022-12-28 ENCOUNTER — Telehealth: Payer: BC Managed Care – PPO | Admitting: Physician Assistant

## 2022-12-28 DIAGNOSIS — J208 Acute bronchitis due to other specified organisms: Secondary | ICD-10-CM | POA: Diagnosis not present

## 2022-12-28 MED ORDER — BENZONATATE 100 MG PO CAPS
100.0000 mg | ORAL_CAPSULE | Freq: Three times a day (TID) | ORAL | 0 refills | Status: DC | PRN
Start: 2022-12-28 — End: 2023-05-18

## 2022-12-28 MED ORDER — ALBUTEROL SULFATE HFA 108 (90 BASE) MCG/ACT IN AERS: 1.0000 | INHALATION_SPRAY | Freq: Four times a day (QID) | RESPIRATORY_TRACT | 0 refills | Status: DC | PRN

## 2022-12-28 NOTE — Progress Notes (Signed)
E-Visit for Cough  We are sorry that you are not feeling well.  Here is how we plan to help!  Based on your presentation I believe you most likely have A cough due to a virus.  This is called viral bronchitis and is best treated by rest, plenty of fluids and control of the cough.  You may use Ibuprofen or Tylenol as directed to help your symptoms.     In addition you may use A non-prescription cough medication called Mucinex DM: take 2 tablets every 12 hours. and A prescription cough medication called Tessalon Perles 100mg . You may take 1-2 capsules every 8 hours as needed for your cough.  Albuterol inhaler has been prescribed as well.   I am unable to prescribe Prednisone (steroids) at this time due to elevated glucose readings. If cough or breathing worsen, please seek in person evaluation.   From your responses in the eVisit questionnaire you describe inflammation in the upper respiratory tract which is causing a significant cough.  This is commonly called Bronchitis and has four common causes:   Allergies Viral Infections Acid Reflux Bacterial Infection Allergies, viruses and acid reflux are treated by controlling symptoms or eliminating the cause. An example might be a cough caused by taking certain blood pressure medications. You stop the cough by changing the medication. Another example might be a cough caused by acid reflux. Controlling the reflux helps control the cough.  USE OF BRONCHODILATOR ("RESCUE") INHALERS: There is a risk from using your bronchodilator too frequently.  The risk is that over-reliance on a medication which only relaxes the muscles surrounding the breathing tubes can reduce the effectiveness of medications prescribed to reduce swelling and congestion of the tubes themselves.  Although you feel brief relief from the bronchodilator inhaler, your asthma may actually be worsening with the tubes becoming more swollen and filled with mucus.  This can delay other crucial  treatments, such as oral steroid medications. If you need to use a bronchodilator inhaler daily, several times per day, you should discuss this with your provider.  There are probably better treatments that could be used to keep your asthma under control.     HOME CARE Only take medications as instructed by your medical team. Complete the entire course of an antibiotic. Drink plenty of fluids and get plenty of rest. Avoid close contacts especially the very young and the elderly Cover your mouth if you cough or cough into your sleeve. Always remember to wash your hands A steam or ultrasonic humidifier can help congestion.   GET HELP RIGHT AWAY IF: You develop worsening fever. You become short of breath You cough up blood. Your symptoms persist after you have completed your treatment plan MAKE SURE YOU  Understand these instructions. Will watch your condition. Will get help right away if you are not doing well or get worse.    Thank you for choosing an e-visit.  Your e-visit answers were reviewed by a board certified advanced clinical practitioner to complete your personal care plan. Depending upon the condition, your plan could have included both over the counter or prescription medications.  Please review your pharmacy choice. Make sure the pharmacy is open so you can pick up prescription now. If there is a problem, you may contact your provider through [employer] and have the prescription routed to another pharmacy.  Your safety is important to [place]. If you have drug allergies check your prescription carefully.   For the next 24 hours you can  use MyChart to ask questions about today's visit, request a non-urgent call back, or ask for a work or school excuse. You will get an email in the next two days asking about your experience. I hope that your e-visit has been valuable and will speed your recovery.  I have spent 5 minutes in review of e-visit questionnaire, review and updating  patient chart, medical decision making and response to patient.   Margaretann Loveless, PA-C

## 2023-01-17 ENCOUNTER — Encounter (INDEPENDENT_AMBULATORY_CARE_PROVIDER_SITE_OTHER): Payer: BC Managed Care – PPO | Admitting: Cardiology

## 2023-01-17 DIAGNOSIS — G4733 Obstructive sleep apnea (adult) (pediatric): Secondary | ICD-10-CM | POA: Diagnosis not present

## 2023-01-19 ENCOUNTER — Ambulatory Visit: Payer: BC Managed Care – PPO | Attending: Cardiovascular Disease

## 2023-01-19 DIAGNOSIS — E119 Type 2 diabetes mellitus without complications: Secondary | ICD-10-CM

## 2023-01-19 DIAGNOSIS — I10 Essential (primary) hypertension: Secondary | ICD-10-CM

## 2023-01-19 DIAGNOSIS — R0683 Snoring: Secondary | ICD-10-CM

## 2023-01-19 NOTE — Procedures (Signed)
       SLEEP STUDY REPORT Patient Information Study Date: 01/17/2023 Patient Name: Molly Clements Patient ID: [id number] Birth Date: Nov 28, 1980 Age: 42 Gender: Female BMI: 45.5 (W=273 lb, H=5' 5'') Referring Physician: Chilton Si, MD  TEST DESCRIPTION: Home sleep apnea testing was completed using the WatchPat, a Type 1 device, utilizing peripheral arterial tonometry (PAT), chest movement, actigraphy, pulse oximetry, pulse rate, body position and snore. AHI was calculated with apnea and hypopnea using valid sleep time as the denominator. RDI includes apneas, hypopneas, and RERAs. The data acquired and the scoring of sleep and all associated events were performed in accordance with the recommended standards and specifications as outlined in the AASM Manual for the Scoring of Sleep and Associated Events 2.2.0 (2015).   FINDINGS: 1.  No evidence of Obstructive Sleep Apnea with AHI 2.5/hr.  2.  No Central Sleep Apnea. 3.  Oxygen desaturations as low as 84%. 4.  Mild snoring was present. O2 sats were < 88% for 0 minutes. 5.  Total sleep time was 6 hrs and 26 min. 6.  16.1% of total sleep time was spent in REM sleep.  7.  Normal sleep onset latency at 22 min.  8.  Normal REM sleep onset latency at 93 min.  9.  Total awakenings were 9.   DIAGNOSIS:  Normal study with no significant sleep disordered breathing.  RECOMMENDATIONS:   1. Normal study with no significant sleep disordered breathing.  2.  Healthy sleep recommendations include:  adequate nightly sleep (normal 7-9 hrs/night), avoidance of caffeine after noon and alcohol near bedtime, and maintaining a sleep environment that is cool, dark and quiet.  3.  Weight loss for overweight patients is recommended.    4.  Snoring recommendations include:  weight loss where appropriate, side sleeping, and avoidance of alcohol before bed.  5.  Operation of motor vehicle or dangerous equipment must be avoided when feeling drowsy,  excessively sleepy, or mentally fatigued.    6.  An ENT consultation which may be useful for specific causes of and possible treatment of bothersome snoring.   7. Weight loss may be of benefit in reducing the severity of snoring.   Signature:   Armanda Magic, MD; [facility]; Diplomat, American Board of Sleep Medicine Electronically Signed: 01/19/2023 9:44:16 AM

## 2023-02-03 ENCOUNTER — Telehealth: Payer: Self-pay

## 2023-02-03 NOTE — Telephone Encounter (Signed)
Patient notified via VM, per DPR, of sleep study results. Left callback number for patient to return call with any questions.

## 2023-02-03 NOTE — Telephone Encounter (Signed)
-----   Message from Armanda Magic sent at 01/19/2023  9:45 AM EDT ----- Please let patient know that sleep study showed no significant sleep apnea.

## 2023-02-26 DIAGNOSIS — F411 Generalized anxiety disorder: Secondary | ICD-10-CM | POA: Diagnosis not present

## 2023-02-26 DIAGNOSIS — F900 Attention-deficit hyperactivity disorder, predominantly inattentive type: Secondary | ICD-10-CM | POA: Diagnosis not present

## 2023-04-05 ENCOUNTER — Encounter (HOSPITAL_BASED_OUTPATIENT_CLINIC_OR_DEPARTMENT_OTHER): Payer: BC Managed Care – PPO | Admitting: Cardiovascular Disease

## 2023-04-05 NOTE — Progress Notes (Incomplete)
Advanced Hypertension Clinic Follow-Up:    Date:  04/05/2023   ID:  Molly Clements, DOB 06-Mar-1981, MRN [id number]  PCP:  Fleet Contras, MD  Cardiologist:  Chilton Si, MD  Nephrologist:  Referring MD: Fleet Contras, MD   CC: Hypertension  History of Present Illness:    Molly Clements is a 42 y.o. female with a hx of hypertension, despression and anxiety here for follow-up. She initially established care in the hypertension clinic 02/21/2020. She was first diagnosed with hypertension about 10 years prior.  Her BP has never been well-controlled.  She saw Dr. Cherly Hensen on 11/19/19 and her BP was 160/90.  She was referred to the [facility] for further management and to consider the appropriate medications as she is interested in getting pregnant.  She has been on losartan/HCTZ for many years.  She was started on nifedipine and referred to [facility]. She followed up with our pharmacist and her blood pressure was poorly controlled so labetalol and hydralazine were added. Cortisol was ordered but the labs were not obtained.   She was seen 02/2021 and her blood pressure was uncontrolled in the setting of being off her medication x 2 weeks. She had self-discontinued labetalol and nifedipine. Prior to that BP was in the 130s/60. She noted LE edema.  She was restarted on valsartan/HCTZ and encouraged to exericse in PREP.  She was tachycardic to 116 bpm.  TSH, CBC and CMP were unremarkable other than hyperglycemia.  At her visit 10/2022, she was feeling well but admitted to some inconsistency with taking her medications due to forgetfulness after work. She usually took all of her antihypertensives around 5 PM. She complained of intermittent leg swelling attributed to sodium intake. Encouraged to limit her sodium intake and increase her exercise. She confirmed snoring and agreed to proceed with an [place] sleep study, which was normal. We also increased her amlodipine to 10 mg daily.  On 12/17/2022 she had a virtual follow-up with Molly Shields, NP and was taking her medications routinely. Home blood pressures were consistently lower than 125 systolic. She continued to work on weight loss and was referred to [facility]. Today, she states that she has been doing well overall without any new concerns. She rarely checks her blood pressure at home. She takes all of her blood pressure medications together around 5PM, but notes that she has been inconsistent with taking her medications due to forgetfulness after work. She has noticed some leg swelling intermittently which she attributes to salt in her diet. She has not been exercising. She states that she plans to lose weight this year with a goal of 50lbs. She has tried to cut out sodas, fried foods, and most sweets. She wants to start a keto diet. She denies any chest pain or pressure or shortness of breath. She states that she snores per her husband, but feels well rested when she wakes up and does not experience any excessive daytime sleepiness or witnessed apneic episodes. She is interested in doing a home sleep study.  Today,  She denies any palpitations, chest pain, shortness of breath, peripheral edema, lightheadedness, headaches, syncope, orthopnea, or PND.  (+)  Previous antihypertensives: Losartan/hctz Labetalol- ran out Nifedipine- ran out Hydralazine- ran out  Past Medical History:  Diagnosis Date   Anxiety    Depression    Hypertension    Migraine    PCOS (polycystic ovarian syndrome)    Snoring 11/19/2022   Tachycardia 03/24/2021    Past  Surgical History:  Procedure Laterality Date   ENDOMETRIAL BIOPSY  2008   polypectomy    Current Medications: No outpatient medications have been marked as taking for the 04/05/23 encounter (Appointment) with Chilton Si, MD.     Allergies:   Bee venom and Labetalol   Social History   Socioeconomic History   Marital status: Married     Spouse name: n/a   Number of children: 0   Years of education: 13.5   Highest education level: Not on file  Occupational History   Occupation: customer service  Tobacco Use   Smoking status: Never   Smokeless tobacco: Never  Substance and Sexual Activity   Alcohol use: No   Drug use: No   Sexual activity: Yes    Partners: Male    Birth control/protection: Pill  Other Topics Concern   Not on file  Social History Narrative   Lives with fiance Molly Clements   Social Determinants of Health   Financial Resource Strain: Low Risk  (03/24/2021)   Overall Financial Resource Strain (CARDIA)    Difficulty of Paying Living Expenses: Not very hard  Food Insecurity: Food Insecurity Present (03/24/2021)   Hunger Vital Sign    Worried About Running Out of Food in the Last Year: Sometimes true    Ran Out of Food in the Last Year: Sometimes true  Transportation Needs: Unmet Transportation Needs (03/24/2021)   PRAPARE - Transportation    Lack of Transportation (Medical): Yes    Lack of Transportation (Non-Medical): Yes  Physical Activity: Inactive (03/24/2021)   Exercise Vital Sign    Days of Exercise per Week: 0 days    Minutes of Exercise per Session: 0 min  Stress: Stress Concern Present (03/24/2021)   [employer] of Occupational Health - Occupational Stress Questionnaire    Feeling of Stress : To some extent  Social Connections: Not on file     Family History: The patient's family history includes Atrial fibrillation in her mother; Cancer in her paternal grandmother; Depression in her father; Drug abuse in her father; Heart failure in her mother; Hypertension in her maternal grandfather and mother; Obesity in her sister; Stroke in her maternal grandmother.  ROS:   Please see the history of present illness.     All other systems reviewed and are negative.  EKGs/Labs/Other Studies Reviewed:    No prior CV studies available.  EKG:  EKG is personally reviewed. 04/05/2023:   *** 11/19/22: Sinus rhythm. Rate of 89 bpm. 03/24/2021: Sinus tachycardia. Rate 116 bpm. Cannot rule out prior anteroseptal infarct. 02/21/20: Sinus tachycardia.  Rate 109 bpm.  Inferolateral ST abnormality.  Cannot rule out prior anteroseptal infarct.  Recent Labs: 11/19/2022: ALT 10; BUN 16; Creatinine, Ser 0.85; Potassium 4.7; Sodium 140   Recent Lipid Panel    Component Value Date/Time   CHOL 141 11/19/2022 0929   TRIG 105 11/19/2022 0929   HDL 46 11/19/2022 0929   CHOLHDL 3.1 11/19/2022 0929   CHOLHDL 4.3 03/23/2015 1028   VLDL 25 03/23/2015 1028   LDLCALC 76 11/19/2022 0929    Physical Exam:    VS:  There were no vitals taken for this visit. , BMI There is no height or weight on file to calculate BMI. GENERAL:  Well appearing HEENT: Pupils equal round and reactive, fundi not visualized, oral mucosa unremarkable NECK:  No jugular venous distention, waveform within normal limits, carotid upstroke brisk and symmetric, no bruits, no thyromegaly LUNGS:  Clear to auscultation bilaterally HEART:  RRR.  PMI not displaced or sustained,S1 and S2 within normal limits, no S3, no S4, no clicks, no rubs, no murmurs ABD:  Flat, positive bowel sounds normal in frequency in pitch, no bruits, no rebound, no guarding, no midline pulsatile mass, no hepatomegaly, no splenomegaly EXT:  2 plus pulses throughout, ***trace edema, no cyanosis no clubbing SKIN:  No rashes no nodules NEURO:  Cranial nerves II through XII grossly intact, motor grossly intact throughout PSYCH:  Cognitively intact, oriented to person place and time  ASSESSMENT:    No diagnosis found.  PLAN:    No problem-specific Assessment & Plan notes found for this encounter.  ***Plan: -  Disposition:    Virtual FU with Molly Shields NP in ***1 month.  Medication Adjustments/Labs and Tests Ordered: Current medicines are reviewed at length with the patient today.  Concerns regarding medicines are outlined above.   No orders  of the defined types were placed in this encounter.  No orders of the defined types were placed in this encounter.  I,Mathew Stumpf,acting as a Neurosurgeon for Chilton Si, MD.,have documented all relevant documentation on the behalf of Chilton Si, MD,as directed by  Chilton Si, MD while in the presence of Chilton Si, MD.  I, Tiffany C. Duke Salvia, MD have reviewed all documentation for this visit.  The documentation of the exam, diagnosis, procedures, and orders on 04/05/2023 are all accurate and complete.  Guadalupe Maple  04/05/2023 7:58 AM    [facility]

## 2023-05-02 ENCOUNTER — Encounter (HOSPITAL_BASED_OUTPATIENT_CLINIC_OR_DEPARTMENT_OTHER): Payer: Self-pay | Admitting: Cardiovascular Disease

## 2023-05-02 NOTE — Telephone Encounter (Signed)
Error

## 2023-05-10 DIAGNOSIS — E559 Vitamin D deficiency, unspecified: Secondary | ICD-10-CM | POA: Diagnosis not present

## 2023-05-10 DIAGNOSIS — Z7189 Other specified counseling: Secondary | ICD-10-CM | POA: Diagnosis not present

## 2023-05-10 DIAGNOSIS — E282 Polycystic ovarian syndrome: Secondary | ICD-10-CM | POA: Diagnosis not present

## 2023-05-10 DIAGNOSIS — Z6841 Body Mass Index (BMI) 40.0 and over, adult: Secondary | ICD-10-CM | POA: Diagnosis not present

## 2023-05-10 DIAGNOSIS — I10 Essential (primary) hypertension: Secondary | ICD-10-CM | POA: Diagnosis not present

## 2023-05-10 DIAGNOSIS — Z0000 Encounter for general adult medical examination without abnormal findings: Secondary | ICD-10-CM | POA: Diagnosis not present

## 2023-05-10 DIAGNOSIS — E119 Type 2 diabetes mellitus without complications: Secondary | ICD-10-CM | POA: Diagnosis not present

## 2023-05-11 ENCOUNTER — Ambulatory Visit (HOSPITAL_BASED_OUTPATIENT_CLINIC_OR_DEPARTMENT_OTHER): Payer: BC Managed Care – PPO | Admitting: Cardiovascular Disease

## 2023-05-11 ENCOUNTER — Encounter (HOSPITAL_BASED_OUTPATIENT_CLINIC_OR_DEPARTMENT_OTHER): Payer: Self-pay | Admitting: Cardiovascular Disease

## 2023-05-11 VITALS — BP 176/80 | HR 96 | Ht 65.0 in | Wt 277.0 lb

## 2023-05-11 DIAGNOSIS — I10 Essential (primary) hypertension: Secondary | ICD-10-CM

## 2023-05-11 DIAGNOSIS — E282 Polycystic ovarian syndrome: Secondary | ICD-10-CM

## 2023-05-11 DIAGNOSIS — E119 Type 2 diabetes mellitus without complications: Secondary | ICD-10-CM | POA: Diagnosis not present

## 2023-05-11 DIAGNOSIS — R0683 Snoring: Secondary | ICD-10-CM

## 2023-05-11 MED ORDER — AMLODIPINE BESYLATE 5 MG PO TABS: 5.0000 mg | ORAL_TABLET | Freq: Every day | ORAL | 3 refills | Status: DC

## 2023-05-11 NOTE — Progress Notes (Deleted)
Advanced Hypertension Clinic Follow-Up:    Date:  05/11/2023   ID:  Molly Clements, DOB [birth year], MRN [id number]  PCP:  Molly Contras, MD  Cardiologist:  Chilton Si, MD  Nephrologist:  Referring MD: Molly Contras, MD   CC: Hypertension  History of Present Illness:    Molly Clements is a 42 y.o. female with a hx of hypertension, despression and anxiety here for follow-up. She initially established care in the hypertension clinic 02/21/2020. She was first diagnosed with hypertension about 10 years prior.  Her BP has never been well-controlled.  She saw Dr. Cherly Hensen on 11/19/19 and her BP was 160/90.  She was referred to the Advanced Hypertension Clinic for further management and to consider the appropriate medications as she is interested in getting pregnant.  She has been on losartan/HCTZ for many years.  She was started on nifedipine and referred to [facility]. She followed up with our pharmacist and her blood pressure was poorly controlled so labetalol and hydralazine were added. Cortisol was ordered but the labs were not obtained.   Today, she is feeling good overall. She has self-discontinued labetalol and nifedipine.   At her visit 02/2021 BP was uncontrolled in the setting of being off her medication x 2 weeks. Prior to that BP was in the 130s/60. She noted LE edema.  She was restarted on valsartan/HCTZ and encouraged to exericse in [facility].  She was tachycardic to 116 bpm.  TSH, CBC and CMP were unremarkable other than hyperglycemia.  At her appointment 10/2022 she was doing well but noted some LE edema in the setting of high sodium intake. Amlodipine was increased.  She followed up with Molly Shields, NP 11/2022 and BP was better controlled.  She was referred to [facility].  She had a sleep study 12/2022 that was negative for OSA.     Previous antihypertensives: Losartan/hctz Labetalol- ran out Nifedipine- ran out Hydralazine- ran out  Past Medical History:  Diagnosis Date    Anxiety    Depression    Hypertension    Migraine    PCOS (polycystic ovarian syndrome)    Snoring 11/19/2022   Tachycardia 03/24/2021    Past Surgical History:  Procedure Laterality Date   ENDOMETRIAL BIOPSY  2008   polypectomy    Current Medications: No outpatient medications have been marked as taking for the 05/11/23 encounter (Appointment) with Chilton Si, MD.     Allergies:   Bee venom and Labetalol   Social History   Socioeconomic History   Marital status: Married    Spouse name: n/a   Number of children: 0   Years of education: 13.5   Highest education level: Not on file  Occupational History   Occupation: customer service  Tobacco Use   Smoking status: Never   Smokeless tobacco: Never  Substance and Sexual Activity   Alcohol use: No   Drug use: No   Sexual activity: Yes    Partners: Male    Birth control/protection: Pill  Other Topics Concern   Not on file  Social History Narrative   Lives with fiance Maysa Lynn   Social Determinants of Health   Financial Resource Strain: Low Risk  (03/24/2021)   Overall Financial Resource Strain (CARDIA)    Difficulty of Paying Living Expenses: Not very hard  Food Insecurity: Food Insecurity Present (03/24/2021)   Hunger Vital Sign    Worried About Running Out of Food in the Last Year: Sometimes true    Ran Out of Food in  the Last Year: Sometimes true  Transportation Needs: Unmet Transportation Needs (03/24/2021)   PRAPARE - Transportation    Lack of Transportation (Medical): Yes    Lack of Transportation (Non-Medical): Yes  Physical Activity: Inactive (03/24/2021)   Exercise Vital Sign    Days of Exercise per Week: 0 days    Minutes of Exercise per Session: 0 min  Stress: Stress Concern Present (03/24/2021)   [employer] of Occupational Health - Occupational Stress Questionnaire    Feeling of Stress : To some extent  Social Connections: Not on file     Family History: The patient's family  history includes Atrial fibrillation in her mother; Cancer in her paternal grandmother; Depression in her father; Drug abuse in her father; Heart failure in her mother; Hypertension in her maternal grandfather and mother; Obesity in her sister; Stroke in her maternal grandmother.  ROS:   Please see the history of present illness.    (+) LE edema (+) Snoring All other systems reviewed and are negative.  EKGs/Labs/Other Studies Reviewed:    No prior CV studies available.  EKG:  11/19/22: Sinus rhythm. Rate of 89 bpm. 03/24/2021: Sinus tachycardia. Rate 116 bpm. Cannot rule out prior anteroseptal infarct. 02/21/20: Sinus tachycardia.  Rate 109 bpm.  Inferolateral ST abnormality.  Cannot rule out prior anteroseptal infarct  Recent Labs: 11/19/2022: ALT 10; BUN 16; Creatinine, Ser 0.85; Potassium 4.7; Sodium 140   Recent Lipid Panel    Component Value Date/Time   CHOL 141 11/19/2022 0929   TRIG 105 11/19/2022 0929   HDL 46 11/19/2022 0929   CHOLHDL 3.1 11/19/2022 0929   CHOLHDL 4.3 03/23/2015 1028   VLDL 25 03/23/2015 1028   LDLCALC 76 11/19/2022 0929    Physical Exam:    VS:  There were no vitals taken for this visit. , BMI There is no height or weight on file to calculate BMI. GENERAL:  Well appearing HEENT: Pupils equal round and reactive, fundi not visualized, oral mucosa unremarkable NECK:  No jugular venous distention, waveform within normal limits, carotid upstroke brisk and symmetric, no bruits, no thyromegaly LUNGS:  Clear to auscultation bilaterally HEART:  RRR.  PMI not displaced or sustained,S1 and S2 within normal limits, no S3, no S4, no clicks, no rubs, no murmurs ABD:  Flat, positive bowel sounds normal in frequency in pitch, no bruits, no rebound, no guarding, no midline pulsatile mass, no hepatomegaly, no splenomegaly EXT:  2 plus pulses throughout, trace edema, no cyanosis no clubbing SKIN:  No rashes no nodules NEURO:  Cranial nerves II through XII grossly  intact, motor grossly intact throughout PSYCH:  Cognitively intact, oriented to person place and time  ASSESSMENT:    No diagnosis found.     PLAN:   No problem-specific Assessment & Plan notes found for this encounter.     Disposition:    Virtual FU with Molly Shields NP in 1 month.   Medication Adjustments/Labs and Tests Ordered: Current medicines are reviewed at length with the patient today.  Concerns regarding medicines are outlined above.  No orders of the defined types were placed in this encounter.   No orders of the defined types were placed in this encounter.   I,Alexis Herring,acting as a Neurosurgeon for Chilton Si, MD.,have documented all relevant documentation on the behalf of Chilton Si, MD,as directed by  Chilton Si, MD while in the presence of Chilton Si, MD.  I, Taner Rzepka C. Duke Salvia, MD have reviewed all documentation for this visit.  The documentation  of the exam, diagnosis, procedures, and orders on 05/11/2023 are all accurate and complete.   Signed, Chilton Si, MD  05/11/2023 9:13 AM    [facility]

## 2023-05-11 NOTE — Patient Instructions (Addendum)
Medication Instructions:  TAKE YOUR MEDICATIONS DAILY   DECREASE YOUR AMLODIPINE TO 5 MG DAILY   Labwork: NONE   Testing/Procedures: NONE  Follow-Up: 06/16/2023 3:30 PM WITH KRISTIN A PHARM D   Any Other Special Instructions Will Be Listed Below (If Applicable).     If you need a refill on your cardiac medications before your next appointment, please call your pharmacy.

## 2023-05-11 NOTE — Progress Notes (Signed)
Advanced Hypertension Clinic Follow-Up:    Date:  05/18/2023   ID:  Molly Clements, DOB [birth year], MRN [id number]  PCP:  Fleet Contras, MD  Cardiologist:  Chilton Si, MD  Nephrologist:  Referring MD: Fleet Contras, MD   CC: Hypertension  History of Present Illness:    Molly Clements is a 42 y.o. female with a hx of hypertension, despression and anxiety here for follow-up. She initially established care in the hypertension clinic 02/21/2020. She was first diagnosed with hypertension about 10 years prior.  Her BP has never been well-controlled.  She saw Dr. Cherly Hensen on 11/19/19 and her BP was 160/90.  She was referred to the [facility] for further management and to consider the appropriate medications as she is interested in getting pregnant.  She has been on losartan/HCTZ for many years.  She was started on nifedipine and referred to PREP. She followed up with our pharmacist and her blood pressure was poorly controlled so labetalol and hydralazine were added. Cortisol was ordered but the labs were not obtained. She had self-discontinued labetalol and nifedipine.   At her visit 02/2021 BP was uncontrolled in the setting of being off her medication x 2 weeks. Prior to that BP was in the 130s/60. She noted LE edema.  She was restarted on valsartan/HCTZ and encouraged to exericse in PREP.  She was tachycardic to 116 bpm.  TSH, CBC and CMP were unremarkable other than hyperglycemia.  At her appointment 10/2022 she was doing well but noted some LE edema in the setting of high sodium intake. Amlodipine was increased.  She followed up with Gillian Shields, NP 11/2022 and BP was better controlled.  She was referred to [facility].  She had a sleep study 12/2022 that was negative for OSA.   Today, her blood pressure is elevated to 172/103 in the office. On manual recheck her BP is 176/80. Over this past weekend she had forgotten to take her antihypertensives, but she did resume her  medications after that. She confirms taking her medications this morning. When she was taking her antihypertensives more consistently, she had home readings closer to 125 systolic. She has noticed some issues with her legs feeling heavier since increasing amlodipine to 10 mg. Her breathing has been stable. She has not been formally exercising, although she has steps at home that she uses without significant shortness of breath. Additionally she admits to some dietary indiscretions recently. Yesterday she saw her PCP and was started on Mounjaro 2.5 mg and Jardiance 10 mg. She also plans to reach out to the Wellness program. She denies any palpitations, chest pain, lightheadedness, headaches, syncope, orthopnea, or PND.  Previous antihypertensives: Losartan/hctz Labetalol- ran out Nifedipine- ran out Hydralazine- ran out  Past Medical History:  Diagnosis Date   Anxiety    Depression    Hypertension    Migraine    PCOS (polycystic ovarian syndrome)    Snoring 11/19/2022   Tachycardia 03/24/2021    Past Surgical History:  Procedure Laterality Date   ENDOMETRIAL BIOPSY  2008   polypectomy    Current Medications: Current Meds  Medication Sig   ADDERALL XR 30 MG 24 hr capsule Take 30 mg by mouth daily as needed.   ALPRAZolam (XANAX) 0.5 MG tablet Take 0.5-1 mg by mouth 3 (three) times daily as needed.   JARDIANCE 10 MG TABS tablet Take 10 mg by mouth daily.   MOUNJARO 2.5 MG/0.5ML Pen Inject 2.5 mg into the skin once a week.  valsartan-hydrochlorothiazide (DIOVAN-HCT) 320-25 MG tablet Take 1 tablet by mouth daily.   [DISCONTINUED] amLODipine (NORVASC) 10 MG tablet Take 1 tablet (10 mg total) by mouth daily. KEEP OV FOR 90 DAY SUPPLY.     Allergies:   Bee venom and Labetalol   Social History   Socioeconomic History   Marital status: Married    Spouse name: n/a   Number of children: 0   Years of education: 13.5   Highest education level: Not on file  Occupational History    Occupation: customer service  Tobacco Use   Smoking status: Never   Smokeless tobacco: Never  Substance and Sexual Activity   Alcohol use: No   Drug use: No   Sexual activity: Yes    Partners: Male    Birth control/protection: Pill  Other Topics Concern   Not on file  Social History Narrative   Lives with fiance Molly Clements   Social Determinants of Health   Financial Resource Strain: Low Risk  (03/24/2021)   Overall Financial Resource Strain (CARDIA)    Difficulty of Paying Living Expenses: Not very hard  Food Insecurity: Food Insecurity Present (03/24/2021)   Hunger Vital Sign    Worried About Running Out of Food in the Last Year: Sometimes true    Ran Out of Food in the Last Year: Sometimes true  Transportation Needs: Unmet Transportation Needs (03/24/2021)   PRAPARE - Transportation    Lack of Transportation (Medical): Yes    Lack of Transportation (Non-Medical): Yes  Physical Activity: Inactive (03/24/2021)   Exercise Vital Sign    Days of Exercise per Week: 0 days    Minutes of Exercise per Session: 0 min  Stress: Stress Concern Present (03/24/2021)   [employer] of Occupational Health - Occupational Stress Questionnaire    Feeling of Stress : To some extent  Social Connections: Not on file     Family History: The patient's family history includes Atrial fibrillation in her mother; Cancer in her paternal grandmother; Depression in her father; Drug abuse in her father; Heart failure in her mother; Hypertension in her maternal grandfather and mother; Obesity in her sister; Stroke in her maternal grandmother.  ROS:   Please see the history of present illness.    (+) Bilateral LE edema All other systems reviewed and are negative.  EKGs/Labs/Other Studies Reviewed:    EKG:  EKG is personally reviewed. 05/11/2023: Not ordered. 11/19/22: Sinus rhythm. Rate of 89 bpm. 03/24/2021: Sinus tachycardia. Rate 116 bpm. Cannot rule out prior anteroseptal infarct. 02/21/20:  Sinus tachycardia.  Rate 109 bpm.  Inferolateral ST abnormality.  Cannot rule out prior anteroseptal infarct  Recent Labs: 11/19/2022: ALT 10; BUN 16; Creatinine, Ser 0.85; Potassium 4.7; Sodium 140   Recent Lipid Panel    Component Value Date/Time   CHOL 141 11/19/2022 0929   TRIG 105 11/19/2022 0929   HDL 46 11/19/2022 0929   CHOLHDL 3.1 11/19/2022 0929   CHOLHDL 4.3 03/23/2015 1028   VLDL 25 03/23/2015 1028   LDLCALC 76 11/19/2022 0929    Physical Exam:    VS:  BP (!) 176/80 (BP Location: Right Arm, Patient Position: Sitting, Cuff Size: Large)   Pulse 96   Ht 5\' 5"  (1.651 m)   Wt 277 lb (125.6 kg)   SpO2 98%   BMI 46.10 kg/m  , BMI Body mass index is 46.1 kg/m. GENERAL:  Well appearing HEENT: Pupils equal round and reactive, fundi not visualized, oral mucosa unremarkable NECK:  No jugular venous distention,  waveform within normal limits, carotid upstroke brisk and symmetric, no bruits, no thyromegaly LUNGS:  Clear to auscultation bilaterally HEART:  RRR.  PMI not displaced or sustained,S1 and S2 within normal limits, no S3, no S4, no clicks, no rubs, no murmurs ABD:  Flat, positive bowel sounds normal in frequency in pitch, no bruits, no rebound, no guarding, no midline pulsatile mass, no hepatomegaly, no splenomegaly EXT:  2 plus pulses throughout, trace edema, no cyanosis no clubbing SKIN:  No rashes no nodules NEURO:  Cranial nerves II through XII grossly intact, motor grossly intact throughout PSYCH:  Cognitively intact, oriented to person place and time  ASSESSMENT:    1. Primary hypertension   2. PCOS (polycystic ovarian syndrome)   3. Morbid obesity (HCC)   4. Essential hypertension   5. Type 2 diabetes mellitus without complication, unspecified whether long term insulin use (HCC)   6. Snoring    PLAN:    # Hypertension Elevated blood pressure readings due to inconsistent medication adherence and poor diet. No symptoms of chest pain or shortness of breath.  Lower extremity swelling noted with Amlodipine 10mg . -Continue Valsartan as prescribed. -Reduce Amlodipine to 5mg  daily to alleviate swelling. -Check blood pressure at home and track readings. -Follow up in 1-2 months to assess blood pressure control.  # Diabetes Recent vacation and poor diet adherence leading to elevated A1C. Currently on Jardiance 10mg . -Resume regular diet and medication regimen. -Will refer her to our Care Guide to establish a healthier routine.  # Lack of Exercise No current exercise routine.   -Encouraged to reach out to wellness program and consider attending cooking classes. -Set a goal of 150 minutes of exercise per week.  Follow-up Plan to meet with health coach to establish an exercise plan.        Disposition:    FU with [facility] Clinic in 1 month.   Medication Adjustments/Labs and Tests Ordered: Current medicines are reviewed at length with the patient today.  Concerns regarding medicines are outlined above.   No orders of the defined types were placed in this encounter.  Meds ordered this encounter  Medications   amLODipine (NORVASC) 5 MG tablet    Sig: Take 1 tablet (5 mg total) by mouth daily. KEEP OV FOR 90 DAY SUPPLY.    Dispense:  90 tablet    Refill:  3    NEW DOSE, D/C 10 MG RX   I,Mathew Stumpf,acting as a scribe for Chilton Si, MD.,have documented all relevant documentation on the behalf of Chilton Si, MD,as directed by  Chilton Si, MD while in the presence of Chilton Si, MD.  I, Kaydence Menard C. Duke Salvia, MD have reviewed all documentation for this visit.  The documentation of the exam, diagnosis, procedures, and orders on 05/18/2023 are all accurate and complete.  Signed, Chilton Si, MD  05/18/2023 4:59 PM    [facility]

## 2023-05-12 DIAGNOSIS — R278 Other lack of coordination: Secondary | ICD-10-CM | POA: Diagnosis not present

## 2023-05-12 DIAGNOSIS — N393 Stress incontinence (female) (male): Secondary | ICD-10-CM | POA: Diagnosis not present

## 2023-05-12 DIAGNOSIS — M6281 Muscle weakness (generalized): Secondary | ICD-10-CM | POA: Diagnosis not present

## 2023-05-18 ENCOUNTER — Encounter (HOSPITAL_BASED_OUTPATIENT_CLINIC_OR_DEPARTMENT_OTHER): Payer: Self-pay | Admitting: Cardiovascular Disease

## 2023-05-18 ENCOUNTER — Ambulatory Visit (HOSPITAL_BASED_OUTPATIENT_CLINIC_OR_DEPARTMENT_OTHER): Payer: BC Managed Care – PPO

## 2023-05-18 DIAGNOSIS — Z0000 Encounter for general adult medical examination without abnormal findings: Secondary | ICD-10-CM

## 2023-05-18 NOTE — Progress Notes (Signed)
HEALTH & WELLNESS COACHING INITIAL INTAKE   Appointment Outcome: Completed, Session #: Initial                        Start time: 10:07am   End time: 11:14am   Total Mins: 67 minutes    What are the Patient's goals from Coaching?  Patient wants to reduce her A1c from an 8.6 by losing weight, reducing the amount of sugar that she consumes daily, and increasing her physical activity.   Why did they seek coaching now? Patient reported that her recent A1c was 8.6 and that her challenge is the amount of sugar that she consumes. Patient wants to get started but is challenged by staying motivated and accountable.     Readiness - What stage is the patient in regarding their goal(s)?  Patient is in the preparation stage of improving her eating habits and the contemplation stage of increasing her physical activity.      Coaching Progress Notes:  Patient shared that her challenge is consuming a lot of sugar. Patient stated that she has started making changing such as eating only one slice of cake and sharing the rest with family and neighbors to avoid eating more. Patient shared that she wants to make changes to her eating habits because her fasting A1c was 8.6. Patient stated that she believes cutting back on sugar and losing weight would help to lower her A1c. Patient stated that she typically has a challenge with remaining motivated and staying accountable once she gets started making behavior changes. Patient shared that she is currently out of work and returns on November the 7th. Patient stated that to help her stay organize she set alarms and reminders so it would be helpful to maintain a schedule where she could incorporate exercise on a regular basis.   Patient stated that she wants to start increasing her water intake. Patient stated that once she loss weight before, she was drinking 100 oz of water per day and she started yesterday. Patient stated that she typically drinks 6 - 16.9 oz  water bottles. Patient stated that she mainly drinks water because she cut out drink sodas and sugary beverages. Patient shared that she does drink diet green teas, iced flavored waters, and hot green tea with turmeric and truvia, and sometimes an apple cider drink. Patient stated that she has begun to make changes to her eating habits by cutting out the bread and some snacks and replacing them with nuts. Patient expressed interest in intermittent fasting from 12pm-8pm.    Coaching Outcomes Discussed with patient the recommendation from the American Heart Association of 25 grams of added sugar per day for women. Reviewed how to read a food label per serving size to determine how to calculate how much added sugar is being consumed per product. Patient expressed interest in tracking the amount of added sugar that she is consuming per day by reading food labels and practicing portion control. Patient stated that she has instruments to use to measure her foods serving sizes. Discussed how to reduce added sugar consumption by eating whole foods vs processed food.   Patient mentioned that she will be taking Mounjaro to aid in weight loss along with her medication to control diabetes. Discussed with patient that it may be helpful to discuss with provider if it is appropriate to practice intermittent fasting or if it is better to eat breakfast at an earlier time during the day.  Patient stated  that she wants to exercise 3-4 times per week (e.g., at least twice at home and one to two times per week at the gym for an average of 30-45 minutes). Patient shared that there are various people that she follows on YouTube and Facebook that she can use to guide her exercises. Patient did express that she would be interested in participating in PREP if that were an option. Patient decided that she wants to track her weight loss by weighing in on Thursday, but has not determine what a reasonable amount of weight would be for  her to lose at this time since she will be taking medication as well.    Patient plans to write down her action steps on her white board at home as a way to help keep herself accountable.     AGREEMENTS SECTION   Overall Goal(s): Reduce A1c from an 8.6 over the next 3 months by reducing her daily sugar intake. Increase physical activity for at least 3-4 days a week for 30-45 minutes to aid in losing weight over the next 3 months. (Patient will determine the amount of weight she feels is reasonable to lose within this time frame during next session).  Agreement/Action Steps:  Reduce daily sugar intake Aim for a maximum of 25 grams of added sugar per day Choose low glycemic index foods Read food labels to practice portion control per serving size Measure serving size Drink ACV in morning Drink 6 - 16.9 oz water bottles/daily Incorporate breakfast daily  Increase physical activity Exercise at home 2xs/week for 30-45 minutes using YouTube Exercise at gym 1-2xs/week for 30-35 minutes Weigh in weekly on Thursdays    Agreement Signed & Returned? Reviewed Coaching Agreement and Code of Ethics with Patient during initial session. Answered any questions the patient had if any regarding the Coaching Agreement and Code of Ethics. Patient verbally agreed to adhere to the Coaching Agreement and to abide by the Code of Ethics.  Mailed patient with a hard/electronic copy of the Coaching Agreement and Code of Ethics.    Resources: Emailed patient an outline of her action steps and provided her with information on how to read food labels, recommended amount of daily added sugar for women per the American Heart Association, the glycemic index along with the diabetes plate method.

## 2023-05-31 ENCOUNTER — Ambulatory Visit (HOSPITAL_BASED_OUTPATIENT_CLINIC_OR_DEPARTMENT_OTHER): Payer: BC Managed Care – PPO

## 2023-05-31 ENCOUNTER — Telehealth (HOSPITAL_BASED_OUTPATIENT_CLINIC_OR_DEPARTMENT_OTHER): Payer: Self-pay

## 2023-05-31 DIAGNOSIS — Z0000 Encounter for general adult medical examination without abnormal findings: Secondary | ICD-10-CM

## 2023-05-31 NOTE — Progress Notes (Signed)
Appointment Outcome: Completed, Session #: 1                        Start time: 11:11am   End time: 11:45am   Total Mins: 34 minutes  AGREEMENTS SECTION   Overall Goal(s): Reduce A1c from an 8.6 over the next 3 months by reducing her daily sugar intake. Increase physical activity for at least 3-4 days a week for 30-45 minutes to aid in losing weight over the next 3 months. (Patient will determine the amount of weight she feels is reasonable to lose within this time frame during next session).   Agreement/Action Steps:  Reduce daily sugar intake Aim for a maximum of 25 grams of added sugar per day Choose low glycemic index foods Read food labels to practice portion control per serving size Measure serving size Drink ACV in morning Drink 6 - 16.9 oz water bottles/daily Incorporate breakfast daily   Increase physical activity Exercise at home 2xs/week for 30-45 minutes using YouTube Exercise at gym 1-2xs/week for 30-35 minutes Weigh in weekly on Thursdays    Progress Notes:  Patient reported that she has been reading food labels some to check for sodium consumption. Patient mentioned that has been choosing products that has less than 300 mg of sodium per serving to maintain a low sodium intake over the past two weeks. Patient stated that she has not checked for added sugar as much because of the keto diet that she has been following. Patient shared that she has been eating eggs, protein, and vegetables mainly. Discussed with patient what additional products that she has used that may have contained added sugar and patient shared that it had not crossed her mind that those products would have contained added sugar. Patient stated that she will start reading the food label more closely on food items that are packaged and processed that she does purchase on occasion.   Patient stated that she has practiced portion control and shared that even with going by the serving size, she is not able to  eat all of the food she portions out. Patient contributes this to taking a Mounjaro, which has decreased her appetite and gets full quicker. Patient reported that she has not started drinking the ACV in the morning so or eating breakfast daily. Patient stated that she has not prepared the drink yet but has the ingredients to do so. Patient mentioned that a good time to prepared the ACV drink would be Thursday so she can start before returning to work so that she can drink it prior to her workouts.   Patient stated that although she has not incorporated breakfast daily she has been researching ideas. Patient stated that she has thought about egg muffins, but is considering a boiled egg with 1/2 grapefruit or a protein smoothie. Patient shared that she has been able to drink between 84.5 - 100 ounces of water per day. Patient stated that the amount of water varied because on some days she used a 24.9-ounce cup to drink water from and did not fill it enough times to meet her goal compared to the days that she drunk 6-16.9-ounce bottles of water. Patient shared that she calculated that she will need to fill her cup 4 times each day to meet her 100-ounce water goal.   Patient stated that she has been able to stared exercising at home 2xs/week for 32 minutes each workout following Koleen Nimrod Bryant's exercise videos. Patient mentioned that she  has not been able to work out at the gym but plans to increase her exercise over the next two weeks. Patient stated that she works out on Tuesdays and Thursdays at 6:30am. Patient reported that since she has been keeping track of her meals, water intake, exercising regularly, that she has lost 10 pounds since her last weigh in on 05-26-23. Patient reported that her last weight was at 267 pounds.    Indicators of Success and Accountability:  Patient reported a loss of 10 pounds over the past two weeks with in of weigh in of 276 on 05-26-23. Patient is tracking her daily water  intake.   Readiness: Patient is in the action stage of reducing daily sugar intake and increasing physical activity.   Strengths and Supports: Patient is being supported by her husband. Patient is relying on being disciplined and accountable.   Challenges and Barriers: Patient does not foresee any challenges/barriers to implementing her action steps.    Coaching Outcomes: Patient plans to continue implementing her action steps without modifications over the next two weeks. Patient stated that this will give her additional time to adjust kick everything in to motion that she has not been able to incorporate and improve in areas that she has room to grow (e.g., reading food labels to track added sugar consumption).   Patient is interested in a workout sheet and meal planning sheet to help with tracking her progress.     Attempted: Fulfilled - Patient is choosing low glycemic index foods and practicing portion control. Patient has weighed in on Thursdays as scheduled.   Partial - Patient has been reading food labels to monitor sodium intake but not as closely for added sugar per serving in processed foods. Patient is drinking 84.5 ounces of water on some days due to using a cup compared to the days that she drinks 6- 16.9-ounce water bottles. Patient has exercised at home 2xs/week for 30 minutes but not at the gym as planned.   Not met - Patient has not started her ACV drink in the morning or incorporating breakfast.   Resources: Emailed patient a copy of an exercise and meal planning sheet.

## 2023-05-31 NOTE — Telephone Encounter (Signed)
Returned patient's call to reschedule appointment. Patient has another appointment tomorrow at 11:00am and wants to reschedule for today if possible. Patient has been rescheduled for 11-5 at 11:00am for an in-person health coaching appointment.    [name], MS, ERHD, [facility]  Care Guide, Health & Wellness Coach [address], [address] [place] [address] Telephone: [phone] Email: [email or web address]

## 2023-06-01 ENCOUNTER — Ambulatory Visit: Payer: BC Managed Care – PPO

## 2023-06-14 ENCOUNTER — Ambulatory Visit: Payer: BC Managed Care – PPO | Attending: Cardiology

## 2023-06-14 DIAGNOSIS — Z0000 Encounter for general adult medical examination without abnormal findings: Secondary | ICD-10-CM

## 2023-06-14 NOTE — Progress Notes (Signed)
Appointment Outcome: Completed, Session #: 2                         Start time: 10:05am   End time: 10:29am   Total Mins: 24 minutes  AGREEMENTS SECTION   Overall Goal(s): Reduce A1c from an 8.6 over the next 3 months by reducing her daily sugar intake. Increase physical activity for at least 3-4 days a week for 30-45 minutes to aid in losing weight over the next 3 months. (Patient will determine the amount of weight she feels is reasonable to lose within this time frame during next session).   Agreement/Action Steps:  Reduce daily sugar intake Aim for a maximum of 25 grams of added sugar per day Choose low glycemic index foods Read food labels to practice portion control per serving size Measure serving size Drink ACV in morning Drink 6 - 16.9 oz water bottles/daily Incorporate breakfast daily   Increase physical activity Exercise at home 2xs/week for 30-45 minutes using YouTube Exercise at gym 1-2xs/week for 30-35 minutes Weigh in weekly on Thursdays    Progress Notes:  Patient reported that she has exercised each week at home. Patient stated that on Monday, Wednesday, and Friday she used YouTube to guide her exercise for approximately 22 minutes each time. Patient stated that on Thursdays she complete stretch band exercises for 10 minutes following a YouTube video. Patient shared that she has not been able to get to the gym due to going back to work. Patient reported that she weighed herself at home on 11/14 and had lost an additional 5 lbs. for a total loss of 15 pounds. Patient stated that she currently weighs 271 lbs.   Patient mentioned that it was easy to manage her added sugar consumption and have not had to read food labels recently because she has been following a keto diet and do not purchase food with a nutrition label. Patient stated that she is eating meat, fresh vegetables, and fruits. Patient stated that she has been eating green grapes for snacks or sugar free ice  with Splenda that is like [place] ice. Patient shared that she also incorporates intermittent fasting and eating between 12-8pm. Patient stated that she has not incorporated breakfast per se but eats her first meal around 12:15pm. Patient mentioned that she would like to find keto friendly smoothies to have as a breakfast option.   Patient reported that she started drinking the ACV in the morning on 11/11. Patient stated that she has noticed a reduction in her salt cravings and have not been eating sunflower seeds daily as she had previously. Patient expressed that she wants to improve her water consumption over the weekend because she is drinking between 4-5 bottles of water instead of 6 as she does daily during the week while working. Patient stated that on the weekends, she becomes distracted with cleaning and other responsibilities that she doesn't drink water has frequently.       Indicators of Success and Accountability:  Patient reported an additional weight loss of 5 lbs. over the past two weeks.   Readiness: Patient is in the action stage of reducing her A1c and increasing her physical activity.  Strengths and Supports: Patient is being supported by her husband. Patient is relying on being a planner and being mindful.   Challenges and Barriers: Patient does not foresee any challenges to implementing her action steps.     Coaching Outcomes: Patient shared that she had  a discussed with her husband about improving her water intake. Patient stated that she will either download an app to track her water consumption or create reminders on her phone as she does with her exercise and medication regimen.    Patient revised her action steps for increasing physical activity as outlined below.    Increase physical activity Exercise at home Mon, Wed & Fri for 22 minutes using YouTube Use stretch bands at home Thurs for 10 minutes Weigh in weekly on Thursdays     Attempted: Fulfilled -  Patient has been able to aim for 25 grams of added sugar per day without a challenge, drink ACV in the morning, and weigh in weekly on Thursdays.  Partial - Patient was able to drink 6 bottles of water 10/14 days. Patient exercised at home the number of days planned but not for the full 30-45 minutes each time.   Not met - Patient did not incorporate breakfast daily. Patient did not exercise at the gym 1-2xs/week.    Not attempted: Dropped/Revised - Patient will not focus on incorporating breakfast prior to 12pm or exercise at the gym 1-2xs/week up to 35 minutes.

## 2023-06-16 ENCOUNTER — Ambulatory Visit (HOSPITAL_BASED_OUTPATIENT_CLINIC_OR_DEPARTMENT_OTHER): Payer: BC Managed Care – PPO | Admitting: Pharmacist Clinician (PhC)/ Clinical Pharmacy Specialist

## 2023-06-16 VITALS — BP 134/84 | HR 90 | Ht 65.0 in | Wt 268.2 lb

## 2023-06-16 DIAGNOSIS — I10 Essential (primary) hypertension: Secondary | ICD-10-CM | POA: Diagnosis not present

## 2023-06-16 NOTE — Patient Instructions (Signed)
Follow up appointment: with Molly Clements in January, I will reach out you in December to schedule this  Take your BP meds as follows:  Move amlodipine to evenings for better balance  Continue with valsartan hctz at mid-day  Check your blood pressure at home daily (if able) and keep record of the readings.  Hypertension "High blood pressure"  Hypertension is often called "The Silent Killer." It rarely causes symptoms until it is extremely  high or has done damage to other organs in the body. For this reason, you should have your  blood pressure checked regularly by your physician. We will check your blood pressure  every time you see a provider at one of our offices.   Your blood pressure reading consists of two numbers. Ideally, blood pressure should be  below 120/80. The first ("top") number is called the systolic pressure. It measures the  pressure in your arteries as your heart beats. The second ("bottom") number is called the diastolic pressure. It measures the pressure in your arteries as the heart relaxes between beats.  The benefits of getting your blood pressure under control are enormous. A 10-point  reduction in systolic blood pressure can reduce your risk of stroke by 27% and heart failure by 28%  Your blood pressure goal is < 130/80  To check your pressure at home you will need to:  1. Sit up in a chair, with feet flat on the floor and back supported. Do not cross your ankles or legs. 2. Rest your left arm so that the cuff is about heart level. If the cuff goes on your upper arm,  then just relax the arm on the table, arm of the chair or your lap. If you have a wrist cuff, we  suggest relaxing your wrist against your chest (think of it as Pledging the Flag with the  wrong arm).  3. Place the cuff snugly around your arm, about 1 inch above the crook of your elbow. The  cords should be inside the groove of your elbow.  4. Sit quietly, with the cuff in place, for about 5  minutes. After that 5 minutes press the power  button to start a reading. 5. Do not talk or move while the reading is taking place.  6. Record your readings on a sheet of paper. Although most cuffs have a memory, it is often  easier to see a pattern developing when the numbers are all in front of you.  7. You can repeat the reading after 1-3 minutes if it is recommended  Make sure your bladder is empty and you have not had caffeine or tobacco within the last 30 min  Always bring your blood pressure log with you to your appointments. If you have not brought your monitor in to be double checked for accuracy, please bring it to your next appointment.  You can find a list of quality blood pressure cuffs at validatebp.org

## 2023-06-16 NOTE — Progress Notes (Unsigned)
Office Visit    Patient Name: Molly Clements Date of Encounter: 06/17/2023  Primary Care Provider:  Fleet Contras, MD Primary Cardiologist:  Chilton Si, MD  Chief Complaint    Hypertension - Advanced hypertension clinic  Past Medical History   DM2   PCOS   obesity Recently started Mounjaro, on 2.5 mg          Allergies  Allergen Reactions   Bee Venom Anaphylaxis    Hives swelling   Labetalol     LEG SWELLING    History of Present Illness    Molly Clements is a 42 y.o. female patient who was referred to the [facility].  She saw Dr. Duke Salvia on 05/11/23. Her blood pressure was 176/80. Over that past weekend she had forgotten to take her antihypertensives, but she did resume after that. When she was taking her antihypertensives more consistently, she had home readings closer to 125 systolic. She was experiencing some lower extremity edema on Amlodipine 10, so we reduced her dose to 5 mg. In the past, she had expressed interest in getting pregnant. She has tried nifedipine, labetalol, and hydralazine. She had been on losartan/HCTZ for many years. She had not been formally exercising, although she has steps at home that she uses without significant shortness of breath.     Today the patient is seen for hypertension follow-up. Overall, she is doing well today. No numbness. She does check her blood pressure at home. Her blood pressure at home is ~120/63 in the mornings and ~145/78 in the evenings. She sets an alarm on her phone each day to remind her to take her medications.  She recently started [place] and has noticed improvements in her weight. (Down 4 kg over past month) She also takes vitamins which consist of primrose, berberine, and vitamin D3. She confirmed that she is not currently considering pregnancy.  Blood Pressure Goal:  130/80  Current Medications: amlodipine 5 mg every day , valsartan hctz 320/25 mg every day    Previously tried:  labetalol - leg swelling  Family Hx: Mother (alive)- HTN, Afib, Heart failure Father (deceased)- Depression, drug abuse Sister (alive)- Obesity     Social Hx:     No alcohol use No drug use Does not smoke  Diet:   She has been on a low carb diet, with intermittent fasting.   Exercise: She has been doing 20 min exercises (resistance) at home, with guidance from YouTube.  Home BP readings:  no readings with her todya   Accessory Clinical Findings    Lab Results  Component Value Date   CREATININE 0.85 11/19/2022   BUN 16 11/19/2022   NA 140 11/19/2022   K 4.7 11/19/2022   CL 102 11/19/2022   CO2 23 11/19/2022   Lab Results  Component Value Date   ALT 10 11/19/2022   AST 11 11/19/2022   ALKPHOS 62 11/19/2022   BILITOT 0.4 11/19/2022   Lab Results  Component Value Date   HGBA1C 10.5 03/23/2015    Screening for Secondary Hypertension: { Click here to document screening for secondary causes of HTN  :1}     Relevant Labs/Studies:    Latest Ref Rng & Units 11/19/2022    9:29 AM 04/03/2021   11:57 AM 03/23/2015   10:28 AM  Basic Labs  Sodium 134 - 144 mmol/L 140  135  136   Potassium 3.5 - 5.2 mmol/L 4.7  4.7  4.7   Creatinine 0.57 -  1.00 mg/dL 6.04  5.40  9.81        Latest Ref Rng & Units 04/03/2021   11:57 AM 03/02/2012    2:29 PM  Thyroid   TSH 0.450 - 4.500 uIU/mL 0.861  2.192                   Home Medications    Current Outpatient Medications  Medication Sig Dispense Refill   ADDERALL XR 30 MG 24 hr capsule Take 30 mg by mouth daily as needed.     albuterol (VENTOLIN HFA) 108 (90 Base) MCG/ACT inhaler Inhale 1-2 puffs into the lungs every 6 (six) hours as needed. (Patient not taking: Reported on 05/11/2023) 8 g 0   ALPRAZolam (XANAX) 0.5 MG tablet Take 0.5-1 mg by mouth 3 (three) times daily as needed.     amLODipine (NORVASC) 5 MG tablet Take 1 tablet (5 mg total) by mouth daily. KEEP OV FOR 90 DAY SUPPLY. 90 tablet 3    JARDIANCE 10 MG TABS tablet Take 10 mg by mouth daily.     MOUNJARO 2.5 MG/0.5ML Pen Inject 2.5 mg into the skin once a week.     valsartan-hydrochlorothiazide (DIOVAN-HCT) 320-25 MG tablet Take 1 tablet by mouth daily. 90 tablet 1   No current facility-administered medications for this visit.     Assessment & Plan       HTN (hypertension) A:  Goal <130/80. Her blood pressure today is 134/84. We recommended the patient starts taking Amlodipine at bedtime, to ensure she has a full day of blood pressure control. She has been doing well on Mounjaro. We discussed with the patient that as she continues to lose weight, she should see further improvements in her BP.  P: Take Amlodpine 5 mg at bedtime. Continue with all other medications. We will reach out at a later time to schedule an appointment for January.  Buddy Duty PharmD candidate [employer] of 2025  I was with student and patient for entire appointment and agree with above assessment and plan.  Phillips Hay PharmD CPP [facility]  [address] [place], [place] [address] [phone]

## 2023-06-17 ENCOUNTER — Encounter (HOSPITAL_BASED_OUTPATIENT_CLINIC_OR_DEPARTMENT_OTHER): Payer: Self-pay | Admitting: Pharmacist Clinician (PhC)/ Clinical Pharmacy Specialist

## 2023-06-17 NOTE — Assessment & Plan Note (Signed)
A:  Goal <130/80. Her blood pressure today is 134/84. We recommended the patient starts taking Amlodipine at bedtime, to ensure she has a full day of blood pressure control. She has been doing well on Mounjaro. We discussed with the patient that as she continues to lose weight, she should see further improvements in her BP.  P: Take Amlodpine 5 mg at bedtime. Continue with all other medications. We will reach out at a later time to schedule an appointment for January.

## 2023-06-27 DIAGNOSIS — I10 Essential (primary) hypertension: Secondary | ICD-10-CM | POA: Diagnosis not present

## 2023-06-27 DIAGNOSIS — E119 Type 2 diabetes mellitus without complications: Secondary | ICD-10-CM | POA: Diagnosis not present

## 2023-06-27 DIAGNOSIS — Z6841 Body Mass Index (BMI) 40.0 and over, adult: Secondary | ICD-10-CM | POA: Diagnosis not present

## 2023-06-27 DIAGNOSIS — Z7189 Other specified counseling: Secondary | ICD-10-CM | POA: Diagnosis not present

## 2023-06-29 ENCOUNTER — Telehealth (HOSPITAL_BASED_OUTPATIENT_CLINIC_OR_DEPARTMENT_OTHER): Payer: Self-pay

## 2023-06-29 ENCOUNTER — Ambulatory Visit (HOSPITAL_BASED_OUTPATIENT_CLINIC_OR_DEPARTMENT_OTHER): Payer: BC Managed Care – PPO

## 2023-06-29 DIAGNOSIS — Z0000 Encounter for general adult medical examination without abnormal findings: Secondary | ICD-10-CM

## 2023-06-29 NOTE — Telephone Encounter (Signed)
Patient called in to inform me that she had to cancel her appointment due to a conflict with dental appointment. Patient requested to be rescheduled for 12/18 at 10:30am. Patient has been rescheduled for an in-person appointment as requested.   [name], MS, ERHD, [facility]  Care Guide, Health & Wellness Coach [address] [place] [address] Telephone: [phone] Email: [email or web address]

## 2023-07-13 ENCOUNTER — Ambulatory Visit (HOSPITAL_BASED_OUTPATIENT_CLINIC_OR_DEPARTMENT_OTHER): Payer: BC Managed Care – PPO

## 2023-07-13 ENCOUNTER — Telehealth (HOSPITAL_BASED_OUTPATIENT_CLINIC_OR_DEPARTMENT_OTHER): Payer: Self-pay

## 2023-07-13 DIAGNOSIS — Z0000 Encounter for general adult medical examination without abnormal findings: Secondary | ICD-10-CM

## 2023-07-13 NOTE — Telephone Encounter (Signed)
Called patient at 3:08pm to reschedule today's appointment. Patient did not answer. Left voicemail informing patient that today's appointment had been cancelled and to call back to reschedule.   [name], MS, ERHD, [facility]  Care Guide, Health & Wellness Coach [address] [place] [address] Telephone: [phone] Email: [email or web address]

## 2023-07-13 NOTE — Telephone Encounter (Signed)
Patient called in regarding missed appointment at 10:58am. Patient stated that she was tied up at work and was not able to make it. Patient mentioned that she would have to start scheduling her appointments either early in the morning during her lunch break. Patient requested a call back later today to be rescheduled.   [name], MS, ERHD, [facility]  Care Guide, Health & Wellness Coach [address] [place] [address] Telephone: [phone] Email: [email or web address]

## 2023-07-13 NOTE — Telephone Encounter (Signed)
Called patient because she had not checked in for visit to determine if she was in route to appointment. Patient did not answer. Left message for patient to return call to hold session or to reschedule if necessary.   [name], MS, ERHD, [facility]  Care Guide, Health & Wellness Coach [address] [place] [address] Telephone: [phone] Email: [email or web address]

## 2023-07-27 ENCOUNTER — Other Ambulatory Visit (HOSPITAL_BASED_OUTPATIENT_CLINIC_OR_DEPARTMENT_OTHER): Payer: Self-pay | Admitting: Family

## 2023-08-15 ENCOUNTER — Other Ambulatory Visit (HOSPITAL_BASED_OUTPATIENT_CLINIC_OR_DEPARTMENT_OTHER): Payer: Self-pay | Admitting: *Deleted

## 2023-08-15 MED ORDER — AMLODIPINE BESYLATE 5 MG PO TABS: 5.0000 mg | ORAL_TABLET | Freq: Every day | ORAL | 3 refills | Status: DC

## 2023-08-15 NOTE — Telephone Encounter (Signed)
Received refill request from [facility]  Refilled as requested

## 2023-08-19 DIAGNOSIS — F411 Generalized anxiety disorder: Secondary | ICD-10-CM | POA: Diagnosis not present

## 2023-08-19 DIAGNOSIS — F900 Attention-deficit hyperactivity disorder, predominantly inattentive type: Secondary | ICD-10-CM | POA: Diagnosis not present

## 2024-02-15 ENCOUNTER — Other Ambulatory Visit: Payer: Self-pay

## 2024-02-15 MED ORDER — VALSARTAN-HYDROCHLOROTHIAZIDE 320-25 MG PO TABS: 1.0000 | ORAL_TABLET | Freq: Every day | ORAL | 0 refills | Status: DC

## 2024-04-18 ENCOUNTER — Telehealth: Payer: Self-pay

## 2024-04-18 NOTE — Telephone Encounter (Signed)
 Patient was identified as falling into the [place] Measure - Diabetes.   Patient was: Patient is not currently using our practice.

## 2024-05-21 ENCOUNTER — Other Ambulatory Visit (HOSPITAL_BASED_OUTPATIENT_CLINIC_OR_DEPARTMENT_OTHER): Payer: Self-pay | Admitting: Cardiovascular Disease

## 2024-06-13 ENCOUNTER — Other Ambulatory Visit (HOSPITAL_BASED_OUTPATIENT_CLINIC_OR_DEPARTMENT_OTHER): Payer: Self-pay | Admitting: Cardiovascular Disease

## 2024-07-20 ENCOUNTER — Other Ambulatory Visit (HOSPITAL_BASED_OUTPATIENT_CLINIC_OR_DEPARTMENT_OTHER): Payer: Self-pay | Admitting: Cardiovascular Disease

## 2024-08-05 ENCOUNTER — Other Ambulatory Visit (HOSPITAL_BASED_OUTPATIENT_CLINIC_OR_DEPARTMENT_OTHER): Payer: Self-pay | Admitting: Cardiovascular Disease

## 2024-08-17 ENCOUNTER — Telehealth: Payer: Self-pay | Admitting: Nurse Practitioner

## 2024-08-17 DIAGNOSIS — M545 Low back pain, unspecified: Secondary | ICD-10-CM

## 2024-08-17 MED ORDER — BACLOFEN 10 MG PO TABS: 10.0000 mg | ORAL_TABLET | Freq: Three times a day (TID) | ORAL | 0 refills | Status: AC

## 2024-08-17 MED ORDER — NAPROXEN 500 MG PO TABS: 500.0000 mg | ORAL_TABLET | Freq: Two times a day (BID) | ORAL | 0 refills | Status: AC

## 2024-08-17 NOTE — Progress Notes (Signed)
 We are sorry that you are not feeling well.  Here is how we plan to help!  Based on what you have shared with me it, appears you may be experiencing acute back pain.   Acute back pain is defined as musculoskeletal pain that can resolve in 1-3 weeks with conservative treatment.  I have prescribed a non-steroid anti-inflammatory (NSAID) Naprosyn  500 mg -- take one by mouth twice a day, as well as a muscle relaxant, Baclofen 10 mg every eight hours as needed. Some patients experience stomach irritation or in increased heartburn with anti-inflammatory drugs.  Please keep in mind that muscle relaxer's can cause fatigue and should not be taken while at work or driving.  Back pain is very common.  The pain often gets better over time.  The cause of back pain is usually not dangerous.  Most people can learn to manage their back pain on their own.  Home Care Stay active.  Start with short walks on flat ground if you can.  Try to walk farther each day. Do not sit, drive or stand in one place for more than 30 minutes.  Do not stay in bed. Do not fully avoid exercise or work.  Activity can help your back heal faster. Be careful when you bend or lift an object.  Bend at your knees, keep the object close to you, and do not twist. Sleep on a firm mattress.  Lie on your side, and bend your knees.  If you lie on your back, put a pillow under your knees. Only take medicines as told by your doctor. Put ice on the injured area. Put ice in a plastic bag Place a towel between your skin and the bag Leave the ice on for 15-20 minutes, 3-4 times a day for the first 2-3 days.  After that, you can switch between ice and heat packs. Ask your doctor about back exercises or massage.   Get Help Right Way If: Your pain does not go away with rest and treatments given today. Your pain does not go away within 1 week. You have new problems. You do not feel well. The pain spreads into your legs. You cannot control when you  poop (bowel movement) or pee (urinate). You feel sick to your stomach (nauseous) or throw up (vomit). You have belly (abdominal) pain. You feel like you may pass out (faint). If you develop a fever.  Make Sure you: Understand these instructions. Continue to monitor your condition for any changes. Will get help right away if you are not doing well or get worse.  Your e-visit answers were reviewed by a board certified advanced clinical practitioner to complete your personal care plan.  Depending on the condition, your plan could have included both over the counter or prescription medications.  If there is a problem, please reply once you have received a response from your provider.  Your safety is important to us .  If you have drug allergies, check your prescription carefully.    You can use MyChart to ask questions about todays visit, request a non-urgent call back, or ask for a work or school excuse for 24 hours related to this e-Visit. If it has been greater than 24 hours you will need to follow up with your provider or enter a new e-Visit to address those concerns.  You will get an e-mail in the next two days asking about your experience.  I hope that your e-visit has been valuable and will speed your  recovery. Thank you for using e-visits.   I have spent 5 minutes in review of e-visit questionnaire, review and updating patient chart, medical decision making and response to patient.   Hadassah Fireman, NP
